# Patient Record
Sex: Female | Born: 1935 | Race: White | Hispanic: No | Marital: Married | Smoking: Never smoker
Health system: Southern US, Community
[De-identification: ages and names within clinical notes are randomized; demographics above are authoritative.]

## PROBLEM LIST (undated history)

## (undated) DIAGNOSIS — I10 Essential (primary) hypertension: Secondary | ICD-10-CM

## (undated) DIAGNOSIS — D649 Anemia, unspecified: Secondary | ICD-10-CM

## (undated) DIAGNOSIS — E119 Type 2 diabetes mellitus without complications: Secondary | ICD-10-CM

## (undated) DIAGNOSIS — H269 Unspecified cataract: Secondary | ICD-10-CM

## (undated) DIAGNOSIS — E785 Hyperlipidemia, unspecified: Secondary | ICD-10-CM

## (undated) DIAGNOSIS — M8580 Other specified disorders of bone density and structure, unspecified site: Secondary | ICD-10-CM

## (undated) DIAGNOSIS — M1990 Unspecified osteoarthritis, unspecified site: Secondary | ICD-10-CM

## (undated) DIAGNOSIS — E871 Hypo-osmolality and hyponatremia: Secondary | ICD-10-CM

## (undated) HISTORY — DX: Hyperlipidemia, unspecified: E78.5

## (undated) HISTORY — DX: Other specified disorders of bone density and structure, unspecified site: M85.80

## (undated) HISTORY — DX: Type 2 diabetes mellitus without complications: E11.9

## (undated) HISTORY — DX: Unspecified cataract: H26.9

## (undated) HISTORY — DX: Unspecified osteoarthritis, unspecified site: M19.90

## (undated) HISTORY — DX: Essential (primary) hypertension: I10

## (undated) HISTORY — PX: ABDOMINAL HYSTERECTOMY: SHX81

---

## 1999-10-14 ENCOUNTER — Other Ambulatory Visit: Admission: RE | Admit: 1999-10-14 | Discharge: 1999-10-14 | Payer: Self-pay | Admitting: Family Medicine

## 2015-11-08 ENCOUNTER — Other Ambulatory Visit: Payer: Self-pay | Admitting: Family Medicine

## 2015-11-08 ENCOUNTER — Ambulatory Visit
Admission: RE | Admit: 2015-11-08 | Discharge: 2015-11-08 | Disposition: A | Discharge status: Home / Self Care | Payer: Medicare Other | Source: Ambulatory Visit | Attending: Family Medicine | Admitting: Family Medicine

## 2015-11-08 DIAGNOSIS — M542 Cervicalgia: Secondary | ICD-10-CM

## 2016-03-08 ENCOUNTER — Ambulatory Visit (INDEPENDENT_AMBULATORY_CARE_PROVIDER_SITE_OTHER): Payer: Medicare Other | Admitting: Physician Assistant

## 2016-03-08 VITALS — BP 138/68 | HR 90 | Temp 97.6°F | Resp 18 | Ht 65.5 in | Wt 140.0 lb

## 2016-03-08 DIAGNOSIS — S0993XA Unspecified injury of face, initial encounter: Secondary | ICD-10-CM

## 2016-03-08 DIAGNOSIS — S80212A Abrasion, left knee, initial encounter: Secondary | ICD-10-CM | POA: Diagnosis not present

## 2016-03-08 DIAGNOSIS — T07XXXA Unspecified multiple injuries, initial encounter: Secondary | ICD-10-CM

## 2016-03-08 MED ORDER — MUPIROCIN 2 % EX OINT: 1.0000 "application " | TOPICAL_OINTMENT | Freq: Every day | CUTANEOUS | Status: AC

## 2016-03-08 NOTE — Patient Instructions (Addendum)
     IF you received an x-ray today, you will receive an invoice from [facility]. Please contact [facility] at [phone] with questions or concerns regarding your invoice.   IF you received labwork today, you will receive an invoice from [facility]. Please contact [facility] at [phone] with questions or concerns regarding your invoice.   Our billing staff will not be able to assist you with questions regarding bills from these companies.  You will be contacted with the lab results as soon as they are available. The fastest way to get your results is to activate your My Chart account. Instructions are located on the last page of this paperwork. If you have not heard from us regarding the results in 2 weeks, please contact this office.    Let's try warm compressions at this time.  You can do this 3 times per day for 15 minutes at least.  Please let me know if you developed increased pain, swelling, drainage.

## 2016-03-08 NOTE — Progress Notes (Signed)
[facility] [address], [place] [id number] [phone]  Date:  03/08/2016   Name:  Verdell FaceBarbara J Kulinski   DOB:  02/25/1936   MRN:  [id number]  PCP:  Cala BradfordWHITE,CYNTHIA S, MD    History of Present Illness:  Verdell FaceBarbara J Aure is a 80 y.o. female patient who [facility] swelling and bruising.    Saturday outside lost of balance while carrying groceries.  No loc.    Cold compresses were used.  She takes a baby aspirin.  She has not noticed vision changes, though her glasses were scratched up during the fall.  She has cleansed this with etOH and hydrogen peroxide.  She has no numbness or tingling.  There was minimal bleeding.    Rare falls.  She is a precautionary walker as granddaughter stated.  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  There are no active problems to display for this patient.   Past Medical History  Diagnosis Date  . Diabetes mellitus without complication (HCC)   . Hypertension   . Osteoarthritis   . Cataract   . Hyperlipidemia   . Osteopenia     Past Surgical History  Procedure Laterality Date  . Abdominal hysterectomy      Social History  Substance Use Topics  . Smoking status: Never Smoker   . Smokeless tobacco: None  . Alcohol Use: None    Family History  Problem Relation Age of Onset  . Hypertension Mother   . Cancer Father     No Known Allergies  Medication list has been reviewed and updated.  Current Outpatient Prescriptions on File Prior to Visit  Medication Sig Dispense Refill  . cholecalciferol (VITAMIN D) 1000 UNITS tablet Take 1,000 Units by mouth 4 (four) times daily as needed.    Marland Kitchen. lisinopril-hydrochlorothiazide (PRINZIDE,ZESTORETIC) 20-25 MG per tablet Take 1 tablet by mouth daily.    . metFORMIN (GLUCOPHAGE) 500 MG tablet Take 500 mg by mouth 2 (two) times daily with a meal.    . Omega-3 Fatty Acids (FISH OIL) 1000 MG CAPS Take by mouth.    . rosuvastatin (CRESTOR) 20 MG tablet Take 20 mg by  mouth daily.     No current facility-administered medications on file prior to visit.    ROS ROS otherwise unremarkable unless listed above.   Physical Examination: BP 138/68 mmHg  Pulse 90  Temp(Src) 97.6 F (36.4 C) (Oral)  Resp 18  Ht 5' 5.5" (1.664 m)  Wt 140 lb (63.504 kg)  BMI 22.93 kg/m2  SpO2 100% Ideal Body Weight: Weight in (lb) to have BMI = 25: 152.2  Physical Exam  Constitutional: She is oriented to person, place, and time. She appears well-developed and well-nourished. No distress.  HENT:  Head: Normocephalic and atraumatic.  Right Ear: External ear normal.  Left Ear: External ear normal.  Mouth/Throat: Normal dentition.  Left side of forehead with hematoma.  This is non-tender.  No tenderness along the occipital bones.  Ecchymosis that extends under the eye and descends into down the cheeks.  These are non-tender.    Eyes: EOM are normal. Pupils are equal, round, and reactive to light. Right conjunctiva is not injected. Left conjunctiva has a hemorrhage. Right eye exhibits normal extraocular motion. Left eye exhibits normal extraocular motion.  Cardiovascular: Normal rate.   Pulmonary/Chest: Effort normal. No respiratory distress.  Musculoskeletal:       Right knee: She exhibits normal range of motion. No tenderness found.  No medial joint line and no lateral joint line tenderness noted.  Neurological: She is alert and oriented to person, place, and time.  Skin: She is not diaphoretic.  Dorsal pip with abrasions, and left knee with 2cm shallow abrasion.  Psychiatric: She has a normal mood and affect. Her behavior is normal.     Assessment and Plan: Verdell FaceBarbara J Strohmeyer is a 80 y.o. female who is here today for facial swelling and bruising. This is healing.  Discussed healing process, and also potential of infection in this hematoma. -advised mupirocin -will use warm compresses at this time.  Advised to return for more alarming symptoms discussed.   Abrasions of  multiple sites - Plan: mupirocin ointment (BACTROBAN) 2 %  Trena PlattStephanie Terryl Niziolek, PA-C [facility] 03/08/2016 9:34 AM

## 2016-05-05 DIAGNOSIS — E871 Hypo-osmolality and hyponatremia: Secondary | ICD-10-CM

## 2016-05-05 DIAGNOSIS — D649 Anemia, unspecified: Secondary | ICD-10-CM

## 2016-05-05 HISTORY — DX: Anemia, unspecified: D64.9

## 2016-05-05 HISTORY — DX: Hypo-osmolality and hyponatremia: E87.1

## 2016-05-06 ENCOUNTER — Ambulatory Visit (INDEPENDENT_AMBULATORY_CARE_PROVIDER_SITE_OTHER): Payer: Medicare Other | Admitting: Family Medicine

## 2016-05-06 ENCOUNTER — Ambulatory Visit (INDEPENDENT_AMBULATORY_CARE_PROVIDER_SITE_OTHER): Payer: Medicare Other

## 2016-05-06 VITALS — BP 144/70 | HR 105 | Temp 97.4°F | Resp 18 | Ht 65.5 in | Wt 128.4 lb

## 2016-05-06 DIAGNOSIS — M5441 Lumbago with sciatica, right side: Secondary | ICD-10-CM

## 2016-05-06 DIAGNOSIS — R700 Elevated erythrocyte sedimentation rate: Secondary | ICD-10-CM | POA: Diagnosis not present

## 2016-05-06 DIAGNOSIS — R3915 Urgency of urination: Secondary | ICD-10-CM | POA: Diagnosis not present

## 2016-05-06 DIAGNOSIS — M5442 Lumbago with sciatica, left side: Secondary | ICD-10-CM

## 2016-05-06 DIAGNOSIS — R350 Frequency of micturition: Secondary | ICD-10-CM | POA: Diagnosis not present

## 2016-05-06 LAB — POCT URINALYSIS DIP (MANUAL ENTRY)
Glucose, UA: NEGATIVE
Nitrite, UA: NEGATIVE
Urobilinogen, UA: 4
pH, UA: 5.5

## 2016-05-06 LAB — POCT SEDIMENTATION RATE: POCT SED RATE: 72 mm/hr — AB (ref 0–22)

## 2016-05-06 MED ORDER — CEPHALEXIN 500 MG PO CAPS: 500.0000 mg | ORAL_CAPSULE | Freq: Two times a day (BID) | ORAL | 0 refills | Status: DC

## 2016-05-06 NOTE — Progress Notes (Signed)
By signing my name below I, Ellen Payne, attest that this documentation has been prepared under the direction and in the presence of Shade Flood, MD. Electonically Signed. Ellen Payne, Scribe 05/06/2016 at 10:09 AM   Subjective:    Patient ID: Ellen Payne, female    DOB: 02/26/1936, 80 y.o.   MRN: [id number]  Chief Complaint  Patient presents with  . Difficulty Walking    x 1 week    HPI Ellen Payne is a 80 y.o. female who [facility] complaining of low back pain that radiates into her buttocks bilat that has been constant for the past week. Pain radiates into her knees bilat. Pt also reports having a burning sensation in her feet bilat for the past week as well. Pt denies any fall or injury prior to pain. Family reports pt is having difficultly walking due to back pain. Pt denies history of back pain. Pt denies any fever, chills, night sweats, dysuria. Pt reports urinary urgency and urinary frequency for the past month. Pt denies urinary or bowel incontinence.   Pt was last seen at [facility] after pt lost her balance while carrying groceries and fell on 03/08/16. Pt denied having back pain after fall. Since pt fell a month ago pt has been using a walker to steady herself because she has been more nervous about falling.   Pt takes baby aspirin and tylenol arthritis daily.   Less strength in knees and ankles. Pt denies any unilateral weakness, facial drooping, slurred speech, or blurry vision.   No recent lab work available.   History of HTN, HLD, and DM  PCP is WHITE,CYNTHIA S, MD   There are no active problems to display for this patient.  Past Medical History:  Diagnosis Date  . Cataract   . Diabetes mellitus without complication (HCC)   . Hyperlipidemia   . Hypertension   . Osteoarthritis   . Osteopenia    Past Surgical History:  Procedure Laterality Date  . ABDOMINAL HYSTERECTOMY     No Known Allergies Prior to Admission  medications   Medication Sig Start Date End Date Taking? Authorizing Provider  cholecalciferol (VITAMIN D) 1000 UNITS tablet Take 1,000 Units by mouth 4 (four) times daily as needed.   Yes Historical Provider, MD  lisinopril-hydrochlorothiazide (PRINZIDE,ZESTORETIC) 20-25 MG per tablet Take 1 tablet by mouth daily.   Yes Historical Provider, MD  metFORMIN (GLUCOPHAGE) 500 MG tablet Take 500 mg by mouth 2 (two) times daily with a meal.   Yes Historical Provider, MD  Omega-3 Fatty Acids (FISH OIL) 1000 MG CAPS Take by mouth.   Yes Historical Provider, MD  rosuvastatin (CRESTOR) 20 MG tablet Take 20 mg by mouth daily.   Yes Historical Provider, MD   Social History   Social History  . Marital status: Married    Spouse name: N/A  . Number of children: N/A  . Years of education: N/A   Occupational History  . Not on file.   Social History Main Topics  . Smoking status: Never Smoker  . Smokeless tobacco: Not on file  . Alcohol use Not on file  . Drug use: Unknown  . Sexual activity: Not on file   Other Topics Concern  . Not on file   Social History Narrative  . No narrative on file      Review of Systems  Constitutional: Negative for diaphoresis and fever.  Genitourinary: Positive for frequency. Negative for dysuria.  Musculoskeletal: Positive for back pain (low back pain, radiating into legs bilat).  Neurological: Positive for weakness (bilat legs).       Objective:   Physical Exam  Constitutional: She is oriented to person, place, and time. She appears well-developed and well-nourished. No distress.  HENT:  Head: Normocephalic and atraumatic.  Eyes: Conjunctivae and EOM are normal. Pupils are equal, round, and reactive to light. Right eye exhibits no nystagmus. Left eye exhibits no nystagmus.  Neck: Neck supple.  Cardiovascular: Normal rate.   Pulmonary/Chest: Effort normal.  Musculoskeletal: Normal range of motion.  Pt is slow to stand, and able to rise on her own with  a walker. Pt is tender around the lower lumbar to sacrum bilat, and is diffusely tender to bilat sciatic notch. SI joints are minimally tender. Pt's ROM slightly guarded due to balance difficulty, otherwise intact. Gait is slow, able to weight bear both legs equally with walker.   Neurological: She is alert and oriented to person, place, and time. She displays no Babinski's sign on the right side. She displays no Babinski's sign on the left side.  Reflex Scores:      Patellar reflexes are 2+ on the right side and 2+ on the left side.      Achilles reflexes are 2+ on the right side and 2+ on the left side. Negative seated straight leg raise bilat.  No pronator drift.   Skin: Skin is warm and dry.  Psychiatric: She has a normal mood and affect. Her behavior is normal.  Nursing note and vitals reviewed.    Vitals:   05/06/16 0836  BP: (!) 144/70  Pulse: (!) 105  Resp: 18  Temp: 97.4 F (36.3 C)  TempSrc: Oral  SpO2: 96%  Weight: 128 lb 6 oz (58.2 kg)  Height: 5' 5.5" (1.664 m)   Dg Lumbar Spine Complete  Result Date: 05/06/2016 CLINICAL DATA:  Low back pain for 1 week. EXAM: LUMBAR SPINE - COMPLETE 4+ VIEW COMPARISON:  None. FINDINGS: No fractures identified. Spondylosis present, most prominently at L5-S1 with facet hypertrophy and anterolisthesis of L5 on S1 of approximately 8 mm. Associated disc space narrowing. Other levels show facet hypertrophy without severe disc space narrowing. Bones are diffusely osteopenic. There may be minimal loss of height of the mid L3 vertebral body, with scalloped appearance of the superior endplate. This does not appear acute. IMPRESSION: 1. Lumbar spondylosis, most prominently at L5-S1. There is a grade 1 anterolisthesis on a of L5 on S1 of approximately 8 mm. 2. Suggestion of mild loss of height at the level of the superior endplate of L3. This does not appear to be an acute compression fracture. Electronically Signed   By: Irish Lack M.D.   On:  05/06/2016 10:06     Results for orders placed or performed in visit on 05/06/16  POCT urinalysis dipstick  Result Value Ref Range   Color, UA yellow yellow   Clarity, UA clear clear   Glucose, UA negative negative   Bilirubin, UA small (A) negative   Ketones, POC UA trace (5) (A) negative   Spec Grav, UA >=1.030    Blood, UA moderate (A) negative   pH, UA 5.5    Protein Ur, POC >=300 (A) negative   Urobilinogen, UA 4.0    Nitrite, UA Negative Negative   Leukocytes, UA small (1+) (A) Negative  POCT SEDIMENTATION RATE  Result Value Ref Range   POCT SED RATE 72 (A) 0 - 22 mm/hr  Assessment & Plan:  .  VIOLA KINNICK is a 80 y.o. female Bilateral low back pain with sciatica, sciatica laterality unspecified - Plan: DG Lumbar Spine Complete, POCT SEDIMENTATION RATE  - Suspected degenerative disc disease with flare, but does have elevated sed rate and new onset back pain.   -initial treatment plan of increasing Tylenol, episodic Aleve, and recheck in 1 week.   - Based on elevated sedimentation rate, will schedule MRI to evaluate for underlying infection versus malignancy. Will try to have this ordered within the next few days. ER precautions if any fever, cauda equina symptoms or worsening symptoms prior to MRI results.  Urinary frequency - Plan: POCT urinalysis dipstick, Urine culture, cephALEXin (KEFLEX) 500 MG capsule, CANCELED: POCT Microscopic Urinalysis ([facility]) Urgency of urination - Plan: cephALEXin (KEFLEX) 500 MG capsule  - Possible early urinary tract infection with newurinary symptoms yesterday.    -Check urine culture, start Keflex.   Meds ordered this encounter  Medications  . cephALEXin (KEFLEX) 500 MG capsule    Sig: Take 1 capsule (500 mg total) by mouth 2 (two) times daily.    Dispense:  14 capsule    Refill:  0   Patient Instructions   You do have some significant arthritis in your low back that may be contributing to some of your pain. There is  also one area that may the an old compression fracture, but no apparent recent fracture. Start with Tylenol more frequently throughout the day up to maximum dose on label. Heat or ice to the affected area as needed. If needed you can take over the counter Advil or Aleve for up to one week. If not improving into next week, I would like you to see an orthopedic specialist. If any worsening of your pain or symptoms in that time, be seen here or other medical provider sooner. Keep follow-up with your primary care provider to discuss your walking difficulty further, or return here or emergency room sooner if this is worsening.  Your urine test did indicate some possible infection, but the urine culture will be more helpful. In the meantime you can start the antibiotic that was prescribed twice per day.   Return to the clinic or go to the nearest emergency room if any of your symptoms worsen or new symptoms occur.   Urinary Tract Infection Urinary tract infections (UTIs) can develop anywhere along your urinary tract. Your urinary tract is your body's drainage system for removing wastes and extra water. Your urinary tract includes two kidneys, two ureters, a bladder, and a urethra. Your kidneys are a pair of bean-shaped organs. Each kidney is about the size of your fist. They are located below your ribs, one on each side of your spine. CAUSES Infections are caused by microbes, which are microscopic organisms, including fungi, viruses, and bacteria. These organisms are so small that they can only be seen through a microscope. Bacteria are the microbes that most commonly cause UTIs. SYMPTOMS  Symptoms of UTIs may vary by age and gender of the patient and by the location of the infection. Symptoms in young women typically include a frequent and intense urge to urinate and a painful, burning feeling in the bladder or urethra during urination. Older women and men are more likely to be tired, shaky, and weak and have  muscle aches and abdominal pain. A fever may mean the infection is in your kidneys. Other symptoms of a kidney infection include pain in your back or sides below the  ribs, nausea, and vomiting. DIAGNOSIS To diagnose a UTI, your caregiver will ask you about your symptoms. Your caregiver will also ask you to provide a urine sample. The urine sample will be tested for bacteria and white blood cells. White blood cells are made by your body to help fight infection. TREATMENT  Typically, UTIs can be treated with medication. Because most UTIs are caused by a bacterial infection, they usually can be treated with the use of antibiotics. The choice of antibiotic and length of treatment depend on your symptoms and the type of bacteria causing your infection. HOME CARE INSTRUCTIONS  If you were prescribed antibiotics, take them exactly as your caregiver instructs you. Finish the medication even if you feel better after you have only taken some of the medication.  Drink enough water and fluids to keep your urine clear or pale yellow.  Avoid caffeine, tea, and carbonated beverages. They tend to irritate your bladder.  Empty your bladder often. Avoid holding urine for long periods of time.  Empty your bladder before and after sexual intercourse.  After a bowel movement, women should cleanse from front to back. Use each tissue only once. SEEK MEDICAL CARE IF:   You have back pain.  You develop a fever.  Your symptoms do not begin to resolve within 3 days. SEEK IMMEDIATE MEDICAL CARE IF:   You have severe back pain or lower abdominal pain.  You develop chills.  You have nausea or vomiting.  You have continued burning or discomfort with urination. MAKE SURE YOU:   Understand these instructions.  Will watch your condition.  Will get help right away if you are not doing well or get worse.   This information is not intended to replace advice given to you by your health care provider. Make sure  you discuss any questions you have with your health care provider.   Document Released: 05/31/2005 Document Revised: 05/12/2015 Document Reviewed: 09/29/2011 Elsevier Interactive Patient Education 2016 Elsevier Inc.   Back Pain, Adult Back pain is very common in adults.The cause of back pain is rarely dangerous and the pain often gets better over time.The cause of your back pain may not be known. Some common causes of back pain include:  Strain of the muscles or ligaments supporting the spine.  Wear and tear (degeneration) of the spinal disks.  Arthritis.  Direct injury to the back. For many people, back pain may return. Since back pain is rarely dangerous, most people can learn to manage this condition on their own. HOME CARE INSTRUCTIONS Watch your back pain for any changes. The following actions may help to lessen any discomfort you are feeling:  Remain active. It is stressful on your back to sit or stand in one place for long periods of time. Do not sit, drive, or stand in one place for more than 30 minutes at a time. Take short walks on even surfaces as soon as you are able.Try to increase the length of time you walk each day.  Exercise regularly as directed by your health care provider. Exercise helps your back heal faster. It also helps avoid future injury by keeping your muscles strong and flexible.  Do not stay in bed.Resting more than 1-2 days can delay your recovery.  Pay attention to your body when you bend and lift. The most comfortable positions are those that put less stress on your recovering back. Always use proper lifting techniques, including:  Bending your knees.  Keeping the load close to your  body.  Avoiding twisting.  Find a comfortable position to sleep. Use a firm mattress and lie on your side with your knees slightly bent. If you lie on your back, put a pillow under your knees.  Avoid feeling anxious or stressed.Stress increases muscle tension and  can worsen back pain.It is important to recognize when you are anxious or stressed and learn ways to manage it, such as with exercise.  Take medicines only as directed by your health care provider. Over-the-counter medicines to reduce pain and inflammation are often the most helpful.Your health care provider may prescribe muscle relaxant drugs.These medicines help dull your pain so you can more quickly return to your normal activities and healthy exercise.  Apply ice to the injured area:  Put ice in a plastic bag.  Place a towel between your skin and the bag.  Leave the ice on for 20 minutes, 2-3 times a day for the first 2-3 days. After that, ice and heat may be alternated to reduce pain and spasms.  Maintain a healthy weight. Excess weight puts extra stress on your back and makes it difficult to maintain good posture. SEEK MEDICAL CARE IF:  You have pain that is not relieved with rest or medicine.  You have increasing pain going down into the legs or buttocks.  You have pain that does not improve in one week.  You have night pain.  You lose weight.  You have a fever or chills. SEEK IMMEDIATE MEDICAL CARE IF:   You develop new bowel or bladder control problems.  You have unusual weakness or numbness in your arms or legs.  You develop nausea or vomiting.  You develop abdominal pain.  You feel faint.   This information is not intended to replace advice given to you by your health care provider. Make sure you discuss any questions you have with your health care provider.   Document Released: 08/21/2005 Document Revised: 09/11/2014 Document Reviewed: 12/23/2013 Elsevier Interactive Patient Education 2016 [employer].   IF you received an x-ray today, you will receive an invoice from [facility]. Please contact [facility] at [phone] with questions or concerns regarding your invoice.   IF you received labwork today, you will receive an invoice  from [facility]. Please contact [facility] at [phone] with questions or concerns regarding your invoice.   Our billing staff will not be able to assist you with questions regarding bills from these companies.  You will be contacted with the lab results as soon as they are available. The fastest way to get your results is to activate your My Chart account. Instructions are located on the last page of this paperwork. If you have not heard from [place] regarding the results in 2 weeks, please contact this office.        I personally performed the services described in this documentation, which was scribed in my presence. The recorded information has been reviewed and considered, and addended by me as needed.   Signed,   Meredith Staggers, MD [facility].  05/06/16 4:28 PM

## 2016-05-06 NOTE — Patient Instructions (Addendum)
You do have some significant arthritis in your low back that may be contributing to some of your pain. There is also one area that may the an old compression fracture, but no apparent recent fracture. Start with Tylenol more frequently throughout the day up to maximum dose on label. Heat or ice to the affected area as needed. If needed you can take over the counter Advil or Aleve for up to one week. If not improving into next week, I would like you to see an orthopedic specialist. If any worsening of your pain or symptoms in that time, be seen here or other medical provider sooner. Keep follow-up with your primary care provider to discuss your walking difficulty further, or return here or emergency room sooner if this is worsening.  Your urine test did indicate some possible infection, but the urine culture will be more helpful. In the meantime you can start the antibiotic that was prescribed twice per day.   Return to the clinic or go to the nearest emergency room if any of your symptoms worsen or new symptoms occur.   Urinary Tract Infection Urinary tract infections (UTIs) can develop anywhere along your urinary tract. Your urinary tract is your body's drainage system for removing wastes and extra water. Your urinary tract includes two kidneys, two ureters, a bladder, and a urethra. Your kidneys are a pair of bean-shaped organs. Each kidney is about the size of your fist. They are located below your ribs, one on each side of your spine. CAUSES Infections are caused by microbes, which are microscopic organisms, including fungi, viruses, and bacteria. These organisms are so small that they can only be seen through a microscope. Bacteria are the microbes that most commonly cause UTIs. SYMPTOMS  Symptoms of UTIs may vary by age and gender of the patient and by the location of the infection. Symptoms in young women typically include a frequent and intense urge to urinate and a painful, burning feeling in  the bladder or urethra during urination. Older women and men are more likely to be tired, shaky, and weak and have muscle aches and abdominal pain. A fever may mean the infection is in your kidneys. Other symptoms of a kidney infection include pain in your back or sides below the ribs, nausea, and vomiting. DIAGNOSIS To diagnose a UTI, your caregiver will ask you about your symptoms. Your caregiver will also ask you to provide a urine sample. The urine sample will be tested for bacteria and white blood cells. White blood cells are made by your body to help fight infection. TREATMENT  Typically, UTIs can be treated with medication. Because most UTIs are caused by a bacterial infection, they usually can be treated with the use of antibiotics. The choice of antibiotic and length of treatment depend on your symptoms and the type of bacteria causing your infection. HOME CARE INSTRUCTIONS  If you were prescribed antibiotics, take them exactly as your caregiver instructs you. Finish the medication even if you feel better after you have only taken some of the medication.  Drink enough water and fluids to keep your urine clear or pale yellow.  Avoid caffeine, tea, and carbonated beverages. They tend to irritate your bladder.  Empty your bladder often. Avoid holding urine for long periods of time.  Empty your bladder before and after sexual intercourse.  After a bowel movement, women should cleanse from front to back. Use each tissue only once. SEEK MEDICAL CARE IF:   You have back pain.  You develop a fever.  Your symptoms do not begin to resolve within 3 days. SEEK IMMEDIATE MEDICAL CARE IF:   You have severe back pain or lower abdominal pain.  You develop chills.  You have nausea or vomiting.  You have continued burning or discomfort with urination. MAKE SURE YOU:   Understand these instructions.  Will watch your condition.  Will get help right away if you are not doing well or get  worse.   This information is not intended to replace advice given to you by your health care provider. Make sure you discuss any questions you have with your health care provider.   Document Released: 05/31/2005 Document Revised: 05/12/2015 Document Reviewed: 09/29/2011 Elsevier Interactive Patient Education 2016 Elsevier Inc.   Back Pain, Adult Back pain is very common in adults.The cause of back pain is rarely dangerous and the pain often gets better over time.The cause of your back pain may not be known. Some common causes of back pain include:  Strain of the muscles or ligaments supporting the spine.  Wear and tear (degeneration) of the spinal disks.  Arthritis.  Direct injury to the back. For many people, back pain may return. Since back pain is rarely dangerous, most people can learn to manage this condition on their own. HOME CARE INSTRUCTIONS Watch your back pain for any changes. The following actions may help to lessen any discomfort you are feeling:  Remain active. It is stressful on your back to sit or stand in one place for long periods of time. Do not sit, drive, or stand in one place for more than 30 minutes at a time. Take short walks on even surfaces as soon as you are able.Try to increase the length of time you walk each day.  Exercise regularly as directed by your health care provider. Exercise helps your back heal faster. It also helps avoid future injury by keeping your muscles strong and flexible.  Do not stay in bed.Resting more than 1-2 days can delay your recovery.  Pay attention to your body when you bend and lift. The most comfortable positions are those that put less stress on your recovering back. Always use proper lifting techniques, including:  Bending your knees.  Keeping the load close to your body.  Avoiding twisting.  Find a comfortable position to sleep. Use a firm mattress and lie on your side with your knees slightly bent. If you lie on  your back, put a pillow under your knees.  Avoid feeling anxious or stressed.Stress increases muscle tension and can worsen back pain.It is important to recognize when you are anxious or stressed and learn ways to manage it, such as with exercise.  Take medicines only as directed by your health care provider. Over-the-counter medicines to reduce pain and inflammation are often the most helpful.Your health care provider may prescribe muscle relaxant drugs.These medicines help dull your pain so you can more quickly return to your normal activities and healthy exercise.  Apply ice to the injured area:  Put ice in a plastic bag.  Place a towel between your skin and the bag.  Leave the ice on for 20 minutes, 2-3 times a day for the first 2-3 days. After that, ice and heat may be alternated to reduce pain and spasms.  Maintain a healthy weight. Excess weight puts extra stress on your back and makes it difficult to maintain good posture. SEEK MEDICAL CARE IF:  You have pain that is not relieved with rest or  medicine.  You have increasing pain going down into the legs or buttocks.  You have pain that does not improve in one week.  You have night pain.  You lose weight.  You have a fever or chills. SEEK IMMEDIATE MEDICAL CARE IF:   You develop new bowel or bladder control problems.  You have unusual weakness or numbness in your arms or legs.  You develop nausea or vomiting.  You develop abdominal pain.  You feel faint.   This information is not intended to replace advice given to you by your health care provider. Make sure you discuss any questions you have with your health care provider.   Document Released: 08/21/2005 Document Revised: 09/11/2014 Document Reviewed: 12/23/2013 Elsevier Interactive Patient Education 2016 [employer]   IF you received an x-ray today, you will receive an invoice from [facility]. Please contact [facility] at [phone]  with questions or concerns regarding your invoice.   IF you received labwork today, you will receive an invoice from [facility]. Please contact [facility] at [phone] with questions or concerns regarding your invoice.   Our billing staff will not be able to assist you with questions regarding bills from these companies.  You will be contacted with the lab results as soon as they are available. The fastest way to get your results is to activate your My Chart account. Instructions are located on the last page of this paperwork. If you have not heard from us regarding the results in 2 weeks, please contact this office.

## 2016-05-08 LAB — URINE CULTURE

## 2016-05-15 ENCOUNTER — Encounter (HOSPITAL_COMMUNITY): Payer: Self-pay | Admitting: Emergency Medicine

## 2016-05-15 DIAGNOSIS — R3915 Urgency of urination: Secondary | ICD-10-CM | POA: Diagnosis present

## 2016-05-15 DIAGNOSIS — Z6820 Body mass index (BMI) 20.0-20.9, adult: Secondary | ICD-10-CM

## 2016-05-15 DIAGNOSIS — A419 Sepsis, unspecified organism: Principal | ICD-10-CM | POA: Diagnosis present

## 2016-05-15 DIAGNOSIS — E538 Deficiency of other specified B group vitamins: Secondary | ICD-10-CM | POA: Diagnosis present

## 2016-05-15 DIAGNOSIS — E119 Type 2 diabetes mellitus without complications: Secondary | ICD-10-CM | POA: Diagnosis present

## 2016-05-15 DIAGNOSIS — M545 Low back pain: Secondary | ICD-10-CM | POA: Diagnosis present

## 2016-05-15 DIAGNOSIS — Z79899 Other long term (current) drug therapy: Secondary | ICD-10-CM

## 2016-05-15 DIAGNOSIS — Z7982 Long term (current) use of aspirin: Secondary | ICD-10-CM

## 2016-05-15 DIAGNOSIS — R4182 Altered mental status, unspecified: Secondary | ICD-10-CM | POA: Diagnosis not present

## 2016-05-15 DIAGNOSIS — E871 Hypo-osmolality and hyponatremia: Secondary | ICD-10-CM | POA: Diagnosis not present

## 2016-05-15 DIAGNOSIS — Z7984 Long term (current) use of oral hypoglycemic drugs: Secondary | ICD-10-CM

## 2016-05-15 DIAGNOSIS — R350 Frequency of micturition: Secondary | ICD-10-CM | POA: Diagnosis present

## 2016-05-15 DIAGNOSIS — E440 Moderate protein-calorie malnutrition: Secondary | ICD-10-CM | POA: Diagnosis present

## 2016-05-15 DIAGNOSIS — D649 Anemia, unspecified: Secondary | ICD-10-CM | POA: Diagnosis present

## 2016-05-15 DIAGNOSIS — I10 Essential (primary) hypertension: Secondary | ICD-10-CM | POA: Diagnosis present

## 2016-05-15 DIAGNOSIS — E860 Dehydration: Secondary | ICD-10-CM | POA: Diagnosis present

## 2016-05-15 DIAGNOSIS — N390 Urinary tract infection, site not specified: Secondary | ICD-10-CM | POA: Diagnosis present

## 2016-05-15 DIAGNOSIS — Z90710 Acquired absence of both cervix and uterus: Secondary | ICD-10-CM

## 2016-05-15 DIAGNOSIS — E876 Hypokalemia: Secondary | ICD-10-CM | POA: Diagnosis present

## 2016-05-15 NOTE — ED Triage Notes (Signed)
Pt here with bilateral hip pain and left knee pain. Pt sts she believes that she has arthritis. No injury. Pt ambulatory with walker.

## 2016-05-16 ENCOUNTER — Emergency Department (HOSPITAL_COMMUNITY): Payer: Medicare Other

## 2016-05-16 ENCOUNTER — Inpatient Hospital Stay (HOSPITAL_COMMUNITY): Payer: Medicare Other

## 2016-05-16 ENCOUNTER — Encounter (HOSPITAL_COMMUNITY): Payer: Self-pay | Admitting: Family Medicine

## 2016-05-16 ENCOUNTER — Inpatient Hospital Stay (HOSPITAL_COMMUNITY)
Admission: EM | Admit: 2016-05-16 | Discharge: 2016-05-22 | DRG: 872 | Disposition: A | Discharge status: Skilled Nursing Facility | Payer: Medicare Other | Attending: Internal Medicine | Admitting: Internal Medicine

## 2016-05-16 DIAGNOSIS — G9341 Metabolic encephalopathy: Secondary | ICD-10-CM | POA: Diagnosis not present

## 2016-05-16 DIAGNOSIS — E119 Type 2 diabetes mellitus without complications: Secondary | ICD-10-CM

## 2016-05-16 DIAGNOSIS — Z79899 Other long term (current) drug therapy: Secondary | ICD-10-CM | POA: Diagnosis not present

## 2016-05-16 DIAGNOSIS — E538 Deficiency of other specified B group vitamins: Secondary | ICD-10-CM | POA: Diagnosis present

## 2016-05-16 DIAGNOSIS — R634 Abnormal weight loss: Secondary | ICD-10-CM

## 2016-05-16 DIAGNOSIS — R4182 Altered mental status, unspecified: Secondary | ICD-10-CM | POA: Diagnosis not present

## 2016-05-16 DIAGNOSIS — D649 Anemia, unspecified: Secondary | ICD-10-CM | POA: Diagnosis present

## 2016-05-16 DIAGNOSIS — A419 Sepsis, unspecified organism: Secondary | ICD-10-CM | POA: Diagnosis present

## 2016-05-16 DIAGNOSIS — Z7984 Long term (current) use of oral hypoglycemic drugs: Secondary | ICD-10-CM | POA: Diagnosis not present

## 2016-05-16 DIAGNOSIS — E440 Moderate protein-calorie malnutrition: Secondary | ICD-10-CM | POA: Diagnosis present

## 2016-05-16 DIAGNOSIS — Z7982 Long term (current) use of aspirin: Secondary | ICD-10-CM | POA: Diagnosis not present

## 2016-05-16 DIAGNOSIS — R350 Frequency of micturition: Secondary | ICD-10-CM | POA: Diagnosis present

## 2016-05-16 DIAGNOSIS — M545 Low back pain, unspecified: Secondary | ICD-10-CM

## 2016-05-16 DIAGNOSIS — N390 Urinary tract infection, site not specified: Secondary | ICD-10-CM | POA: Diagnosis present

## 2016-05-16 DIAGNOSIS — I10 Essential (primary) hypertension: Secondary | ICD-10-CM | POA: Diagnosis present

## 2016-05-16 DIAGNOSIS — E876 Hypokalemia: Secondary | ICD-10-CM | POA: Diagnosis present

## 2016-05-16 DIAGNOSIS — Z6820 Body mass index (BMI) 20.0-20.9, adult: Secondary | ICD-10-CM | POA: Diagnosis not present

## 2016-05-16 DIAGNOSIS — R9431 Abnormal electrocardiogram [ECG] [EKG]: Secondary | ICD-10-CM | POA: Diagnosis present

## 2016-05-16 DIAGNOSIS — E871 Hypo-osmolality and hyponatremia: Secondary | ICD-10-CM

## 2016-05-16 DIAGNOSIS — Z90710 Acquired absence of both cervix and uterus: Secondary | ICD-10-CM | POA: Diagnosis not present

## 2016-05-16 DIAGNOSIS — E860 Dehydration: Secondary | ICD-10-CM | POA: Diagnosis present

## 2016-05-16 DIAGNOSIS — R3915 Urgency of urination: Secondary | ICD-10-CM | POA: Diagnosis present

## 2016-05-16 DIAGNOSIS — I4581 Long QT syndrome: Secondary | ICD-10-CM

## 2016-05-16 DIAGNOSIS — R262 Difficulty in walking, not elsewhere classified: Secondary | ICD-10-CM

## 2016-05-16 HISTORY — DX: Hypo-osmolality and hyponatremia: E87.1

## 2016-05-16 HISTORY — DX: Anemia, unspecified: D64.9

## 2016-05-16 LAB — TSH: TSH: 1.396 u[IU]/mL (ref 0.350–4.500)

## 2016-05-16 LAB — BASIC METABOLIC PANEL
Anion gap: 12 (ref 5–15)
BUN: 15 mg/dL (ref 6–20)
CHLORIDE: 88 mmol/L — AB (ref 101–111)
CO2: 31 mmol/L (ref 22–32)
CREATININE: 0.73 mg/dL (ref 0.44–1.00)
Calcium: 9 mg/dL (ref 8.9–10.3)
GFR calc non Af Amer: >60 mL/min (ref >60)
Glucose, Bld: 124 mg/dL — ABNORMAL HIGH (ref 65–99)
POTASSIUM: 3 mmol/L — AB (ref 3.5–5.1)
Sodium: 131 mmol/L — ABNORMAL LOW (ref 135–145)

## 2016-05-16 LAB — COMPREHENSIVE METABOLIC PANEL
ALK PHOS: 154 U/L — AB (ref 38–126)
ALT: 17 U/L (ref 14–54)
AST: 36 U/L (ref 15–41)
Albumin: 3.1 g/dL — ABNORMAL LOW (ref 3.5–5.0)
Anion gap: 14 (ref 5–15)
BILIRUBIN TOTAL: 0.8 mg/dL (ref 0.3–1.2)
BUN: 23 mg/dL — AB (ref 6–20)
CALCIUM: 8.7 mg/dL — AB (ref 8.9–10.3)
CO2: 28 mmol/L (ref 22–32)
Chloride: 84 mmol/L — ABNORMAL LOW (ref 101–111)
Creatinine, Ser: 0.77 mg/dL (ref 0.44–1.00)
GFR calc Af Amer: >60 mL/min (ref >60)
Glucose, Bld: 183 mg/dL — ABNORMAL HIGH (ref 65–99)
POTASSIUM: 2.8 mmol/L — AB (ref 3.5–5.1)
Sodium: 126 mmol/L — ABNORMAL LOW (ref 135–145)
TOTAL PROTEIN: 5.9 g/dL — AB (ref 6.5–8.1)

## 2016-05-16 LAB — GLUCOSE, CAPILLARY
GLUCOSE-CAPILLARY: 175 mg/dL — AB (ref 65–99)
GLUCOSE-CAPILLARY: 186 mg/dL — AB (ref 65–99)
Glucose-Capillary: 164 mg/dL — ABNORMAL HIGH (ref 65–99)
Glucose-Capillary: 135 mg/dL — ABNORMAL HIGH (ref 65–99)

## 2016-05-16 LAB — CBC WITH DIFFERENTIAL/PLATELET
Basophils Absolute: 0 10*3/uL (ref 0.0–0.1)
Basophils Relative: 0 %
Eosinophils Absolute: 0 10*3/uL (ref 0.0–0.7)
Eosinophils Relative: 0 %
HEMATOCRIT: 33.3 % — AB (ref 36.0–46.0)
Hemoglobin: 11.1 g/dL — ABNORMAL LOW (ref 12.0–15.0)
LYMPHS ABS: 0.8 10*3/uL (ref 0.7–4.0)
LYMPHS PCT: 7 %
MCH: 30.7 pg (ref 26.0–34.0)
MCHC: 33.3 g/dL (ref 30.0–36.0)
MCV: 92 fL (ref 78.0–100.0)
MONO ABS: 1 10*3/uL (ref 0.1–1.0)
MONOS PCT: 8 %
NEUTROS ABS: 10.4 10*3/uL — AB (ref 1.7–7.7)
Neutrophils Relative %: 85 %
Platelets: 655 10*3/uL — ABNORMAL HIGH (ref 150–400)
RBC: 3.62 MIL/uL — ABNORMAL LOW (ref 3.87–5.11)
RDW: 13.5 % (ref 11.5–15.5)
WBC: 12.2 10*3/uL — ABNORMAL HIGH (ref 4.0–10.5)

## 2016-05-16 LAB — URINE MICROSCOPIC-ADD ON

## 2016-05-16 LAB — FERRITIN: Ferritin: 93 ng/mL (ref 11–307)

## 2016-05-16 LAB — URINALYSIS, ROUTINE W REFLEX MICROSCOPIC
BILIRUBIN URINE: NEGATIVE
GLUCOSE, UA: NEGATIVE mg/dL
KETONES UR: NEGATIVE mg/dL
Nitrite: POSITIVE — AB
PH: 5.5 (ref 5.0–8.0)
Protein, ur: 100 mg/dL — AB

## 2016-05-16 LAB — LACTIC ACID, PLASMA
LACTIC ACID, VENOUS: 2.8 mmol/L — AB (ref 0.5–1.9)
LACTIC ACID, VENOUS: 2.2 mmol/L — AB (ref 0.5–1.9)

## 2016-05-16 LAB — VITAMIN B12: Vitamin B-12: 66 pg/mL — ABNORMAL LOW (ref 180–914)

## 2016-05-16 LAB — MAGNESIUM: Magnesium: 1.3 mg/dL — ABNORMAL LOW (ref 1.7–2.4)

## 2016-05-16 LAB — I-STAT CG4 LACTIC ACID, ED: LACTIC ACID, VENOUS: 2.11 mmol/L — AB (ref 0.5–1.9)

## 2016-05-16 LAB — SEDIMENTATION RATE: SED RATE: 26 mm/h — AB (ref 0–22)

## 2016-05-16 LAB — OSMOLALITY: Osmolality: 277 mOsm/kg (ref 275–295)

## 2016-05-16 MED ORDER — DEXTROSE 5 % IV SOLN
1.0000 g | INTRAVENOUS | Status: DC
  Administered 2016-05-16 – 2016-05-21 (×6): 1 g via INTRAVENOUS
  Filled 2016-05-16 (×8): qty 10

## 2016-05-16 MED ORDER — SODIUM CHLORIDE 0.9 % IV BOLUS (SEPSIS)
500.0000 mL | Freq: Once | INTRAVENOUS | Status: AC
  Administered 2016-05-16: 500 mL via INTRAVENOUS

## 2016-05-16 MED ORDER — POTASSIUM CHLORIDE CRYS ER 20 MEQ PO TBCR
20.0000 meq | EXTENDED_RELEASE_TABLET | Freq: Once | ORAL | Status: AC
  Administered 2016-05-16: 20 meq via ORAL
  Filled 2016-05-16: qty 1

## 2016-05-16 MED ORDER — ACETAMINOPHEN 325 MG PO TABS
650.0000 mg | ORAL_TABLET | Freq: Four times a day (QID) | ORAL | Status: DC | PRN
  Administered 2016-05-16 – 2016-05-22 (×7): 650 mg via ORAL
  Filled 2016-05-16 (×7): qty 2

## 2016-05-16 MED ORDER — IOPAMIDOL (ISOVUE-300) INJECTION 61%
15.0000 mL | INTRAVENOUS | Status: AC
  Administered 2016-05-16: 15 mL via ORAL

## 2016-05-16 MED ORDER — ASPIRIN EC 81 MG PO TBEC
81.0000 mg | DELAYED_RELEASE_TABLET | Freq: Every day | ORAL | Status: DC
  Administered 2016-05-16 – 2016-05-22 (×7): 81 mg via ORAL
  Filled 2016-05-16 (×7): qty 1

## 2016-05-16 MED ORDER — ATORVASTATIN CALCIUM 40 MG PO TABS
40.0000 mg | ORAL_TABLET | Freq: Every day | ORAL | Status: DC
  Administered 2016-05-16 – 2016-05-21 (×6): 40 mg via ORAL
  Filled 2016-05-16 (×6): qty 1

## 2016-05-16 MED ORDER — FLUCONAZOLE 100 MG PO TABS
150.0000 mg | ORAL_TABLET | Freq: Once | ORAL | Status: AC
  Administered 2016-05-16: 150 mg via ORAL
  Filled 2016-05-16: qty 2

## 2016-05-16 MED ORDER — MAGNESIUM SULFATE 2 GM/50ML IV SOLN
2.0000 g | Freq: Once | INTRAVENOUS | Status: AC
  Administered 2016-05-16: 2 g via INTRAVENOUS
  Filled 2016-05-16: qty 50

## 2016-05-16 MED ORDER — ACETAMINOPHEN 650 MG RE SUPP: 650.0000 mg | Freq: Four times a day (QID) | RECTAL | Status: DC | PRN

## 2016-05-16 MED ORDER — POTASSIUM CHLORIDE CRYS ER 20 MEQ PO TBCR
60.0000 meq | EXTENDED_RELEASE_TABLET | Freq: Once | ORAL | Status: AC
  Administered 2016-05-16: 60 meq via ORAL
  Filled 2016-05-16: qty 3

## 2016-05-16 MED ORDER — INSULIN ASPART 100 UNIT/ML ~~LOC~~ SOLN
0.0000 [IU] | Freq: Every day | SUBCUTANEOUS | Status: DC
  Administered 2016-05-20 – 2016-05-21 (×2): 2 [IU] via SUBCUTANEOUS

## 2016-05-16 MED ORDER — SODIUM CHLORIDE 0.9 % IV BOLUS (SEPSIS): 1000.0000 mL | Freq: Once | INTRAVENOUS | Status: DC

## 2016-05-16 MED ORDER — SODIUM CHLORIDE 0.9 % IV BOLUS (SEPSIS)
1000.0000 mL | Freq: Once | INTRAVENOUS | Status: AC
  Administered 2016-05-16: 1000 mL via INTRAVENOUS

## 2016-05-16 MED ORDER — SODIUM CHLORIDE 0.9% FLUSH
3.0000 mL | Freq: Two times a day (BID) | INTRAVENOUS | Status: DC
  Administered 2016-05-16 – 2016-05-22 (×11): 3 mL via INTRAVENOUS

## 2016-05-16 MED ORDER — ENSURE ENLIVE PO LIQD
237.0000 mL | Freq: Two times a day (BID) | ORAL | Status: DC
  Administered 2016-05-16 – 2016-05-17 (×3): 237 mL via ORAL

## 2016-05-16 MED ORDER — ENOXAPARIN SODIUM 40 MG/0.4ML ~~LOC~~ SOLN
40.0000 mg | SUBCUTANEOUS | Status: DC
  Administered 2016-05-16: 40 mg via SUBCUTANEOUS
  Filled 2016-05-16 (×2): qty 0.4

## 2016-05-16 MED ORDER — CYANOCOBALAMIN 1000 MCG/ML IJ SOLN
1000.0000 ug | Freq: Every day | INTRAMUSCULAR | Status: DC
  Administered 2016-05-16 – 2016-05-22 (×7): 1000 ug via INTRAMUSCULAR
  Filled 2016-05-16 (×7): qty 1

## 2016-05-16 MED ORDER — DEXTROSE 5 % IV SOLN
1.0000 g | Freq: Once | INTRAVENOUS | Status: AC
  Administered 2016-05-16: 1 g via INTRAVENOUS
  Filled 2016-05-16: qty 10

## 2016-05-16 MED ORDER — POTASSIUM CHLORIDE CRYS ER 20 MEQ PO TBCR
20.0000 meq | EXTENDED_RELEASE_TABLET | Freq: Two times a day (BID) | ORAL | Status: DC
  Administered 2016-05-16 – 2016-05-22 (×13): 20 meq via ORAL
  Filled 2016-05-16 (×13): qty 1

## 2016-05-16 MED ORDER — IOPAMIDOL (ISOVUE-300) INJECTION 61%
INTRAVENOUS | Status: AC
  Administered 2016-05-16: 80 mL
  Filled 2016-05-16: qty 100

## 2016-05-16 MED ORDER — INSULIN ASPART 100 UNIT/ML ~~LOC~~ SOLN
0.0000 [IU] | Freq: Three times a day (TID) | SUBCUTANEOUS | Status: DC
  Administered 2016-05-16 (×2): 2 [IU] via SUBCUTANEOUS
  Administered 2016-05-16: 1 [IU] via SUBCUTANEOUS
  Administered 2016-05-17: 2 [IU] via SUBCUTANEOUS
  Administered 2016-05-17: 5 [IU] via SUBCUTANEOUS
  Administered 2016-05-17: 2 [IU] via SUBCUTANEOUS
  Administered 2016-05-18 – 2016-05-19 (×4): 3 [IU] via SUBCUTANEOUS
  Administered 2016-05-19 (×2): 2 [IU] via SUBCUTANEOUS
  Administered 2016-05-20: 3 [IU] via SUBCUTANEOUS
  Administered 2016-05-20 – 2016-05-21 (×3): 2 [IU] via SUBCUTANEOUS
  Administered 2016-05-21 – 2016-05-22 (×4): 3 [IU] via SUBCUTANEOUS

## 2016-05-16 NOTE — Progress Notes (Signed)
Patient was admitted earlier this morning. H&P reviewed. Patient seen and examined. Discussed with patient and daughter.  Patient feels better. Still weak. Denies any chest pain, shortness of breath. No nausea, vomiting. Has been finding it difficult to ambulate for the past many days along with worsening back pain over the last few weeks.  Vital signs reviewed. Note to be stable. Heart rate has improved.  Lungs are clear to auscultation bilaterally. No wheezing, rales or rhonchi. S1, S2 normal. Regular. No S3, S4. No rubs, murmurs, or bruit. No pedal edema. Abdomen is soft, nontender, nondistended. Bowel sounds are present. No masses or organomegaly No facial asymmetry. Tongue is midline. Motor strength appears to be equal bilaterally. Brisk reflexes noted in the right lower extremity.  Labs reviewed. Vitamin B-12 to be low. Folic acid level will be ordered. Sodium level has improved. Potassium remains low, though improved. Lactic acid is 2.8. CT scan of the abdomen and pelvis along with CT scan of the chest is pending. ESR is 26.  Management as outlined in the H&P. Continue antibiotics. Await cultures. Follow-up on results of CT scan of the chest, abdomen and pelvis. She may need to undergo MRI of her lumbar spine if CT scan does not show anything obvious. Continue to monitor electrolytes. Fluid bolus. Repeat lactic acid level. Replace vitamin B-12 daily for now. Follow-up on folic acid level.  Bonnielee Haff 05/16/2016

## 2016-05-16 NOTE — Progress Notes (Signed)
CRITICAL VALUE ALERT  Critical value received:  lactid acid  Date of notification:  05/16/2016  Time of notification:  9pm  Critical value read back: yes  Nurse who received alert: Lonia Farberekha  MD notified (1st page):  MD night coverage  Time of first page: 9.15pm

## 2016-05-16 NOTE — ED Notes (Signed)
Patient transported to X-ray 

## 2016-05-16 NOTE — H&P (Signed)
History and Physical  Patient Name: Ellen Payne     QVZ:[id number]    DOB: December 06, 1935    DOA: 05/16/2016 PCP: Vidal Schwalbe, MD   Patient coming from: Home  Chief Complaint: Back and leg pain and inability to walk  HPI: Ellen Payne is a 80 y.o. female with a past medical history significant for NIDDM, HTN who presents with back pain and leg weakness and fever.  The patient was in her usual state of health until sometime in July, she fell on the stairs, and developed back pain afterwards. Since then she has had progressive low back pain, not improved with over-the-counter analgesics. One week ago she was seen in urgent care, lumbar radiographs were negative, she was diagnosed with UTI because of urinary symptoms and pyuria, started on cephalexin (which she completed, culture grew pan-sensitive E coli). She did have an elevated ESR, and so MRI of the lumbar spine is planned.  Since then her pain has progressively gotten worse.  It is aching in the low back, worse with movement, radiating into both legs.  She has trouble walking because of pain.  She has had no fever, chills, nausea, vomiting.  She has had persistent urinary urgency and urinary frequency.  Per daughter, her husband has witnessed some episodes of bizarre behavior over the last 2 weeks (once being anxious for hours about having "8 cellphones" that she had to recharge).  Today, she went to her PCP who ordered blood work and called her later to tell her to go to the ER because of low sodium, elevated alkaline phosphatase, low potassium, and very low vitamin B12.  ED course: -Afebrile, heart rate 120s, respirations 18, blood pressure pulse oximetry normal -Na 126 (no previous baseline), K 2.8, Cr 0.77 (baseline unknown), WBC 12.2 K, Hgb 10.1 -Magnesium 1.3, TSH normal -Urinalysis nitrites and leukocytes present -Lactic acid 2.11 -Radiographs of the hips, lumbar spine and sacrum showed only degenerative lumbar  disease -Cultures were obtained, ceftriaxone was administered as well as supplemental K and magnesium and TRH were asked to evaluate for admission  Of note, daughter feels that she has had a progressive mental decline over the last year. She lives with her legally blind husband for whom she is the primary caregiver. She still drives, and walks without a cane or walker except for the last 2 weeks when she has been using a walker.    ROS: Review of Systems  Constitutional: Positive for weight loss (12 lbs in last 1-2 months). Negative for chills and fever.  Respiratory: Negative for cough, sputum production and shortness of breath.   Gastrointestinal: Negative for abdominal pain, diarrhea and vomiting.  Genitourinary: Positive for frequency and urgency. Negative for dysuria, flank pain and hematuria.  Musculoskeletal: Positive for back pain.  Neurological: Positive for weakness (bilateral legs).  Psychiatric/Behavioral: Positive for memory loss (gradual over year). The patient is nervous/anxious.   All other systems reviewed and are negative.         Past Medical History:  Diagnosis Date  . Cataract   . Diabetes mellitus without complication ([place])   . Hyperlipidemia   . Hypertension   . Osteoarthritis   . Osteopenia     Past Surgical History:  Procedure Laterality Date  . ABDOMINAL HYSTERECTOMY      Social History: Patient lives with her husband who is legally blind and for whom she is primary caregiver.  The patient walks unassisted at baseline, with a walker in the last two weeks.  She still drives.  She has no previous history of dementia, but family are concerned with her memory over the last year.  She is a never smoker.  She does not use alcohol.    No Known Allergies  Family history: family history includes Heart attack in her son; Hypertension in her mother; Lung cancer in her brother and father.  Prior to Admission medications   Medication Sig Start Date End Date  Taking? Authorizing Provider  acetaminophen (ACETAMINOPHEN 8 HOUR) 650 MG CR tablet Take 650 mg by mouth every 8 (eight) hours. For hip pain. (Daughter states she takes everyday for hip pain)   Yes Historical Provider, MD  aspirin EC 81 MG tablet Take 81 mg by mouth daily.   Yes Historical Provider, MD  atorvastatin (LIPITOR) 40 MG tablet Take 40 mg by mouth at bedtime.   Yes Historical Provider, MD  Cholecalciferol (VITAMIN D3) 5000 units TABS Take 1 tablet by mouth daily.   Yes Historical Provider, MD  lisinopril-hydrochlorothiazide (PRINZIDE,ZESTORETIC) 20-25 MG per tablet Take 1 tablet by mouth daily.   Yes Historical Provider, MD  metFORMIN (GLUCOPHAGE) 500 MG tablet Take 1,000 mg by mouth 2 (two) times daily with a meal.    Yes Historical Provider, MD  cephALEXin (KEFLEX) 500 MG capsule Take 1 capsule (500 mg total) by mouth 2 (two) times daily. Patient not taking: Reported on 05/16/2016 05/06/16   Wendie Agreste, MD       Physical Exam: BP 104/73   Pulse 89   Temp 98.2 F (36.8 C) (Oral)   Resp 18   Ht 5' 6"  (1.676 m)   Wt 58.1 kg (128 lb)   SpO2 96%   BMI 20.66 kg/m  General appearance: Thin elderly female, alert and in no acute distress.   Eyes: Anicteric, conjunctiva pink, lids and lashes normal. PERRL.    ENT: No nasal deformity, discharge, epistaxis.  Hearing normal. OP very dry without lesions.   Neck: No neck masses.  Trachea midline.  No thyromegaly/tenderness. Lymph: No cervical or supraclavicular lymphadenopathy. Skin: Warm and dry.  No jaundice.  No suspicious rashes or lesions. Cardiac: RRR, nl S1-S2, no murmurs appreciated.  Capillary refill is brisk.  JVP normal.  No LE edema.  Radial and DP pulses 2+ and symmetric. Respiratory: Normal respiratory rate and rhythm.  CTAB without rales or wheezes. Abdomen: Abdomen soft.  No TTP. No ascites, distension, hepatosplenomegaly.   MSK: No deformities or effusions.  No cyanosis or clubbing. Neuro: Cranial nerves normal.   Sensation intact to light touch. Speech is fluent.  Muscle strength 5 over 5 in upper and lower extremities bilaterally.  ?great toe dorsiflexion diminished on LEFT. Reflexes 2+ and symmetric at the knees. Psych: Sensorium intact and responding to questions, attention diminshed.  Behavior slightly anxious.  Affect anxious.  Judgment and insight appear impaired.     Labs on Admission:  I have personally reviewed following labs and imaging studies: CBC:  Recent Labs Lab 05/16/16 0201  WBC 12.2*  NEUTROABS 10.4*  HGB 11.1*  HCT 33.3*  MCV 92.0  PLT 161*   Basic Metabolic Panel:  Recent Labs Lab 05/16/16 0201 05/16/16 0319  NA 126*  --   K 2.8*  --   CL 84*  --   CO2 28  --   GLUCOSE 183*  --   BUN 23*  --   CREATININE 0.77  --   CALCIUM 8.7*  --   MG  --  1.3*   GFR:  Estimated Creatinine Clearance (by C-G formula based on SCr of 0.8 mg/dL) Female: 51.9 mL/min Female: 61 mL/min  Liver Function Tests:  Recent Labs Lab 05/16/16 0201  AST 36  ALT 17  ALKPHOS 154*  BILITOT 0.8  PROT 5.9*  ALBUMIN 3.1*   Thyroid Function Tests:  Recent Labs  05/16/16 0201  TSH 1.396   Sepsis Labs: Lactic acid level 2.11   Invalid input(s): PROCALCITONIN, LACTICIDVEN Recent Results (from the past 240 hour(s))  Urine culture     Status: None   Collection Time: 05/06/16  9:44 AM  Result Value Ref Range Status   Culture ESCHERICHIA COLI  Final   Colony Count >=100,000 COLONIES/ML  Final   Organism ID, Bacteria ESCHERICHIA COLI  Final      Susceptibility   Escherichia coli -  (no method available)    AMPICILLIN 8 Sensitive     AMOX/CLAVULANIC 4 Sensitive     AMPICILLIN/SULBACTAM <=2 Sensitive     PIP/TAZO <=4 Sensitive     IMIPENEM <=0.25 Sensitive     CEFAZOLIN <=4 Not Reportable     CEFTRIAXONE <=1 Sensitive     CEFTAZIDIME <=1 Sensitive     CEFEPIME <=1 Sensitive     GENTAMICIN <=1 Sensitive     TOBRAMYCIN <=1 Sensitive     CIPROFLOXACIN <=0.25 Sensitive      LEVOFLOXACIN <=0.12 Sensitive     NITROFURANTOIN <=16 Sensitive     TRIMETH/SULFA* <=20 Sensitive      * NR=NOT REPORTABLE,SEE COMMENTORAL therapy:A cefazolin MIC of <32 predicts susceptibility to the oral agents cefaclor,cefdinir,cefpodoxime,cefprozil,cefuroxime,cephalexin,and loracarbef when used for therapy of uncomplicated UTIs due to E.coli,K.pneumomiae,and P.mirabilis. PARENTERAL therapy: A cefazolinMIC of >8 indicates resistance to parenteralcefazolin. An alternate test method must beperformed to confirm susceptibility to parenteralcefazolin.         Radiological Exams on Admission: Personally reviewed: Dg Lumbar Spine Complete  Result Date: 05/16/2016 CLINICAL DATA:  Bilateral hip, lower lumbosacral and sacral pain for 1 week. Progressive pain. No known injury. EXAM: LUMBAR SPINE - COMPLETE 4+ VIEW COMPARISON:  Radiographs 05/06/2016 FINDINGS: The degree of anterolisthesis of L5 on S1 is unchanged measuring 8 mm. There is associated disc space narrowing and facet arthropathy. Unchanged facet arthropathy throughout the lumbar spine additional levels. Suggestion of mild superior endplate concavity of L3 is unchanged. Milder disc space narrowing and endplate spurring at U2-P5 and T12-L1. No acute fracture. Sclerotic focus noted in the sacrum. Atherosclerosis of the intra-abdominal vasculature. IMPRESSION: Stable degenerative change from radiographs 10 days prior. This is most prominent at L5-S1 with degenerative disc disease, facet arthropathy, and anterolisthesis. No acute bony abnormality. Electronically Signed   By: Jeb Levering M.D.   On: 05/16/2016 02:45   Dg Sacrum/coccyx  Result Date: 05/16/2016 CLINICAL DATA:  Bilateral hip, lower lumbosacral and sacral pain for 1 week. Progressive pain. No known injury. EXAM: SACRUM AND COCCYX - 2+ VIEW COMPARISON:  None. FINDINGS: There is no evidence of fracture. Sacral ala are maintained. Sclerotic density within the mid sacrum, favored to be  benign bone island. Sacroiliac joints are congruent. IMPRESSION: No acute bony abnormality. Sclerotic focus in the mid sacrum, likely a benign bone island in the absence of known malignancy. Electronically Signed   By: Jeb Levering M.D.   On: 05/16/2016 02:40   Dg Hips Bilat W Or Wo Pelvis 3-4 Views  Result Date: 05/16/2016 CLINICAL DATA:  Bilateral hip, lower lumbosacral and sacral pain for 1 week. Progressive pain. No known injury. EXAM: DG HIP (WITH OR  WITHOUT PELVIS) 3-4V BILAT COMPARISON:  No prior hyper pelvis radiograph. FINDINGS: The cortical margins of the bony pelvis and both hips are intact. No fracture. Pubic symphysis and sacroiliac joints are congruent. Both femoral heads are well-seated in the respective acetabula. Scattered sclerotic densities over the sacrum both iliac bones are nonspecific, favored benign bone islands. IMPRESSION: No acute bony abnormality to explain bilateral hip pain. Electronically Signed   By: Jeb Levering M.D.   On: 05/16/2016 02:37    EKG: Independently reviewed. Rate 115, QTc 509, sinus rhythm, no ST changes.    Assessment/Plan 1. Sepsis from UTI:  Suspected source UTI. Organism unknown. Patient meets criteria given tachycardia, tachypnea, leukocytosis, and evidence of organ dysfunction.  Lactate 2.11 mmol/L and repeat ordered within 6 hours.  Antibiotics delivered in the ED.    -Sepsis bundle utilized:  -Blood and urine cultures drawn  -Start targeted antibiotics with ceftriaxone, based on suspected source of infection    -Repeat renal function and complete blood count in AM  -Code SEPSIS called to E-link  -CT abdomen and pelvis with contrast to rule out abscess or renal stone       2. Back pain and leg weakness: -CT abdomen and pelvis is ordered -Check ESR, if elevated, will obtain MRI lumbar spine  3. Hypokalemia:  Repleted in ER. -Potassium 20 mEq oral BID -Repeat BMP   4. Hyponatremia:  Appears dehydrated.   -Will given 1L NS  now to rehydrate initially -Repeat BMP in 6 hours -Check urine and serum osmolality  5. Hypomagnesemia:  Repleted.  6. Anemia, normocytic:  No previous for comparison. -Check iron studies -Check B12  7. HTN:  -Hold HCTZ -Hold lisinopril until hemodynamics clearer -Continue statin and aspirin  8. NIDDM:  -Hold metformin  -SSI with meals  9. Prolonged QTc: -Monitor on telemetry -Correct electrolytes -Repeat ECG tomorrow  10. Yeast on urine specimen: -Fluconazole per EDP    DVT prophylaxis: Lovenox  Code Status: FULL  Family Communication: Daughter at bedside.  Diagnosis was discussed, an opportunity for questions was given and all questions were answered.   CODE STATUS confirmed. Disposition Plan: Anticipate IV antibiotics and following culture data.  Close monitoring of electrolytes. Consults called: None Admission status: INPATIENT, telemetry    Medical decision making: Patient seen at 5:20 AM on 05/16/2016.  The patient was discussed with Dr. Kathrynn Humble.  What exists of the patient's chart was reviewed in depth and summarized above.  Clinical condition: stable heart rate and respirations at my evaluation.        Edwin Dada [facility] Pager [phone]

## 2016-05-16 NOTE — Progress Notes (Signed)
Pharmacy Antibiotic Note  [name] is a 80 y.o. female admitted on 05/16/2016 with UTI.  Pharmacy has been consulted for Rocephin dosing.  Plan: Rocephin 1g IV Q24H.  Pharmacy will sign off.  Height: 5\' 6"  (167.6 cm) Weight: 125 lb (56.7 kg) IBW/kg (Calculated) : 59.3  Temp (24hrs), Avg:98.2 F (36.8 C), Min:98.2 F (36.8 C), Max:98.2 F (36.8 C)   Recent Labs Lab 05/16/16 0201 05/16/16 0330  WBC 12.2*  --   CREATININE 0.77  --   LATICACIDVEN  --  2.11*    Estimated Creatinine Clearance (by C-G formula based on SCr of 0.8 mg/dL) Female: 84.651.9 mL/min Female: 61 mL/min    No Known Allergies   Thank you for allowing pharmacy to be a part of this patient's care.  [name], PharmD, BCPS  05/16/2016 6:46 AM

## 2016-05-16 NOTE — ED Notes (Signed)
Admitting MD at the bedside.  

## 2016-05-16 NOTE — ED Notes (Signed)
Pt states she is unable to stay siting up or stand up for getting this VS done.

## 2016-05-16 NOTE — ED Provider Notes (Signed)
[facility]-EMERGENCY DEPT Provider Note   CSN: [id number] Arrival date & time: 05/15/16  2118  By signing my name below, I, Octavia Heir, attest that this documentation has been prepared under the direction and in the presence of Derwood Kaplan, MD.  Electronically Signed: Octavia Heir, ED Scribe. 05/16/16. 1:48 AM.   History   Chief Complaint Chief Complaint  Patient presents with  . Hip Pain  . Knee Pain   The history is provided by the patient. No language interpreter was used.   HPI Comments: Ellen Payne is a 80 y.o. female who has a PMhx of DM, HLD, HTN, and osteoarthritis presents to the Emergency Department complaining of sudden onset, gradual worsening, bilateral hip and knee pain onset x 2 weeks. Daughter notes pt has developed balance issues over the past year. Pt expresses feeling dizzy upon standing. She ambulates with a cane but has been ambulating with a walker lately. She reports her decline in her ability to walk is due to pain in both of her knees and her tailbone. Pt has fallen twice in the past 8 months. She notes that she ambulates very slowly and carefully due to increase in not feeling "balanced" when ambulating. Pt was evaluated by her PCP yesterday and was told to follow up with [facility]. It is noted pt's sodium levels were 125. She is currently on Lisinopril htcz. She denies hx of cancer. There are no injuries noted.  PCP: Cala Bradford, MD  Past Medical History:  Diagnosis Date  . Anemia 05/2016  . Cataract   . Diabetes mellitus without complication (HCC)   . Hyperlipidemia   . Hypertension   . Hyponatremia 05/2016  . Osteoarthritis   . Osteopenia     Patient Active Problem List   Diagnosis Date Noted  . Malnutrition of moderate degree 05/19/2016  . Encephalopathy, metabolic   . Lower back pain   . Prolonged QT interval 05/16/2016  . Hypomagnesemia 05/16/2016  . Hypokalemia 05/16/2016  . Hyponatremia 05/16/2016  . Acute hyponatremia  05/16/2016  . Sepsis (HCC) 05/16/2016  . UTI (lower urinary tract infection) 05/16/2016  . Anemia, unspecified 05/16/2016  . Type 2 diabetes mellitus without complication, without long-term current use of insulin (HCC) 05/16/2016  . Essential hypertension 05/16/2016    Past Surgical History:  Procedure Laterality Date  . ABDOMINAL HYSTERECTOMY      OB History    No data available       Home Medications    Prior to Admission medications   Medication Sig Start Date End Date Taking? Authorizing Provider  aspirin EC 81 MG tablet Take 81 mg by mouth daily.   Yes Historical Provider, MD  atorvastatin (LIPITOR) 40 MG tablet Take 40 mg by mouth at bedtime.   Yes Historical Provider, MD  Cholecalciferol (VITAMIN D3) 5000 units TABS Take 1 tablet by mouth daily.   Yes Historical Provider, MD  metFORMIN (GLUCOPHAGE) 500 MG tablet Take 1,000 mg by mouth 2 (two) times daily with a meal.    Yes Historical Provider, MD  acetaminophen (TYLENOL) 325 MG tablet Take 2 tablets (650 mg total) by mouth every 6 (six) hours as needed for mild pain (or Fever >/= 101). 05/22/16   Leana Roe Elgergawy, MD  cyanocobalamin (,VITAMIN B-12,) 1000 MCG/ML injection Please take once weekly every Monday for 4 weeks, then change to once monthly. 05/22/16   Leana Roe Elgergawy, MD  feeding supplement, GLUCERNA SHAKE, (GLUCERNA SHAKE) LIQD Take 237 mLs by mouth 2 (two) times  daily between meals. 05/23/16   Leana Roe Elgergawy, MD  lisinopril (PRINIVIL,ZESTRIL) 20 MG tablet Take 1 tablet (20 mg total) by mouth daily. 05/23/16   Leana Roe Elgergawy, MD  sodium chloride 1 g tablet Take 1 tablet (1 g total) by mouth 2 (two) times daily with a meal. 05/22/16   Starleen Arms, MD    Family History Family History  Problem Relation Age of Onset  . Hypertension Mother   . Lung cancer Father   . Lung cancer Brother   . Heart attack Son     Social History Social History  Substance Use Topics  . Smoking status: Never Smoker    . Smokeless tobacco: Never Used  . Alcohol use No     Allergies   Review of patient's allergies indicates no known allergies.   Review of Systems Review of Systems  A complete 10 system review of systems was obtained and all systems are negative except as noted in the HPI and PMH.   Physical Exam Updated Vital Signs BP (!) 164/68 (BP Location: Right Arm)   Pulse (!) 104   Temp 98.1 F (36.7 C) (Oral)   Resp 18   Ht 5\' 6"  (1.676 m)   Wt 128 lb 8 oz (58.3 kg)   SpO2 97%   BMI 20.74 kg/m   Physical Exam  Constitutional: She is oriented to person, place, and time. She appears well-developed and well-nourished. No distress.  HENT:  Head: Normocephalic and atraumatic.  Eyes: EOM are normal.  Pupils are 4mm and reactive to light No vertical or horizontal nystagmus  Neck: Normal range of motion. No JVD present.  Cardiovascular: Regular rhythm.  Tachycardia present.   Murmur (systolic) heard. Pulmonary/Chest: Effort normal and breath sounds normal.  Lungs clear on anterior ausculation  Abdominal: Soft. She exhibits no distension. There is no tenderness.  Musculoskeletal: Normal range of motion. She exhibits tenderness.  Pelvis is stable however pt has tenderness over hip joint bilaterally Lower extremity strength 4/5 bilaterally Pt has tenderness over lower lumbar spine and sacral region, no focal tenderness   Neurological: She is alert and oriented to person, place, and time.  Skin: Skin is warm and dry.  Psychiatric: She has a normal mood and affect. Judgment normal.  Nursing note and vitals reviewed.    ED Treatments / Results  DIAGNOSTIC STUDIES: Oxygen Saturation is 100% on RA, normal by my interpretation.  COORDINATION OF CARE:  1:44 AM Discussed treatment plan with pt at bedside and pt agreed to plan.  Labs (all labs ordered are listed, but only abnormal results are displayed) Labs Reviewed  URINE CULTURE - Abnormal; Notable for the following:        Result Value   Culture MULTIPLE SPECIES PRESENT, SUGGEST RECOLLECTION (*)    All other components within normal limits  CBC WITH DIFFERENTIAL/PLATELET - Abnormal; Notable for the following:    WBC 12.2 (*)    RBC 3.62 (*)    Hemoglobin 11.1 (*)    HCT 33.3 (*)    Platelets 655 (*)    Neutro Abs 10.4 (*)    All other components within normal limits  URINALYSIS, ROUTINE W REFLEX MICROSCOPIC (NOT AT [facility]) - Abnormal; Notable for the following:    Color, Urine AMBER (*)    APPearance CLOUDY (*)    Specific Gravity, Urine >1.046 (*)    Hgb urine dipstick SMALL (*)    Protein, ur 100 (*)    Nitrite POSITIVE (*)  Leukocytes, UA SMALL (*)    All other components within normal limits  COMPREHENSIVE METABOLIC PANEL - Abnormal; Notable for the following:    Sodium 126 (*)    Potassium 2.8 (*)    Chloride 84 (*)    Glucose, Bld 183 (*)    BUN 23 (*)    Calcium 8.7 (*)    Total Protein 5.9 (*)    Albumin 3.1 (*)    Alkaline Phosphatase 154 (*)    All other components within normal limits  URINE MICROSCOPIC-ADD ON - Abnormal; Notable for the following:    Squamous Epithelial / LPF 6-30 (*)    Bacteria, UA MANY (*)    All other components within normal limits  MAGNESIUM - Abnormal; Notable for the following:    Magnesium 1.3 (*)    All other components within normal limits  VITAMIN B12 - Abnormal; Notable for the following:    Vitamin B-12 66 (*)    All other components within normal limits  LACTIC ACID, PLASMA - Abnormal; Notable for the following:    Lactic Acid, Venous 2.8 (*)    All other components within normal limits  SEDIMENTATION RATE - Abnormal; Notable for the following:    Sed Rate 26 (*)    All other components within normal limits  GLUCOSE, CAPILLARY - Abnormal; Notable for the following:    Glucose-Capillary 164 (*)    All other components within normal limits  BASIC METABOLIC PANEL - Abnormal; Notable for the following:    Sodium 131 (*)    Potassium 3.0 (*)     Chloride 88 (*)    Glucose, Bld 124 (*)    All other components within normal limits  GLUCOSE, CAPILLARY - Abnormal; Notable for the following:    Glucose-Capillary 135 (*)    All other components within normal limits  FOLATE RBC - Abnormal; Notable for the following:    Hematocrit 30.0 (*)    All other components within normal limits  CBC - Abnormal; Notable for the following:    WBC 10.6 (*)    RBC 3.38 (*)    Hemoglobin 10.3 (*)    HCT 31.5 (*)    Platelets 574 (*)    All other components within normal limits  BASIC METABOLIC PANEL - Abnormal; Notable for the following:    Sodium 129 (*)    Chloride 91 (*)    Glucose, Bld 123 (*)    Calcium 8.8 (*)    All other components within normal limits  GLUCOSE, CAPILLARY - Abnormal; Notable for the following:    Glucose-Capillary 175 (*)    All other components within normal limits  LACTIC ACID, PLASMA - Abnormal; Notable for the following:    Lactic Acid, Venous 2.2 (*)    All other components within normal limits  GLUCOSE, CAPILLARY - Abnormal; Notable for the following:    Glucose-Capillary 186 (*)    All other components within normal limits  GLUCOSE, CAPILLARY - Abnormal; Notable for the following:    Glucose-Capillary 159 (*)    All other components within normal limits  GLUCOSE, CAPILLARY - Abnormal; Notable for the following:    Glucose-Capillary 272 (*)    All other components within normal limits  GLUCOSE, CAPILLARY - Abnormal; Notable for the following:    Glucose-Capillary 191 (*)    All other components within normal limits  BASIC METABOLIC PANEL - Abnormal; Notable for the following:    Sodium 129 (*)    Chloride 92 (*)  Glucose, Bld 253 (*)    Calcium 8.8 (*)    All other components within normal limits  MAGNESIUM - Abnormal; Notable for the following:    Magnesium 1.5 (*)    All other components within normal limits  GLUCOSE, CAPILLARY - Abnormal; Notable for the following:    Glucose-Capillary 178 (*)      All other components within normal limits  GLUCOSE, CAPILLARY - Abnormal; Notable for the following:    Glucose-Capillary 217 (*)    All other components within normal limits  GLUCOSE, CAPILLARY - Abnormal; Notable for the following:    Glucose-Capillary 185 (*)    All other components within normal limits  GLUCOSE, CAPILLARY - Abnormal; Notable for the following:    Glucose-Capillary 202 (*)    All other components within normal limits  CBC - Abnormal; Notable for the following:    RBC 3.80 (*)    Hemoglobin 11.7 (*)    HCT 35.9 (*)    Platelets 538 (*)    All other components within normal limits  BASIC METABOLIC PANEL - Abnormal; Notable for the following:    Sodium 128 (*)    Chloride 92 (*)    Glucose, Bld 187 (*)    All other components within normal limits  GLUCOSE, CAPILLARY - Abnormal; Notable for the following:    Glucose-Capillary 168 (*)    All other components within normal limits  GLUCOSE, CAPILLARY - Abnormal; Notable for the following:    Glucose-Capillary 171 (*)    All other components within normal limits  BASIC METABOLIC PANEL - Abnormal; Notable for the following:    Sodium 128 (*)    Chloride 92 (*)    Glucose, Bld 186 (*)    All other components within normal limits  MAGNESIUM - Abnormal; Notable for the following:    Magnesium 1.6 (*)    All other components within normal limits  GLUCOSE, CAPILLARY - Abnormal; Notable for the following:    Glucose-Capillary 229 (*)    All other components within normal limits  GLUCOSE, CAPILLARY - Abnormal; Notable for the following:    Glucose-Capillary 186 (*)    All other components within normal limits  GLUCOSE, CAPILLARY - Abnormal; Notable for the following:    Glucose-Capillary 189 (*)    All other components within normal limits  GLUCOSE, CAPILLARY - Abnormal; Notable for the following:    Glucose-Capillary 193 (*)    All other components within normal limits  GLUCOSE, CAPILLARY - Abnormal; Notable for  the following:    Glucose-Capillary 224 (*)    All other components within normal limits  BASIC METABOLIC PANEL - Abnormal; Notable for the following:    Sodium 129 (*)    Chloride 94 (*)    Glucose, Bld 144 (*)    BUN 23 (*)    GFR calc non Af Amer 60 (*)    All other components within normal limits  GLUCOSE, CAPILLARY - Abnormal; Notable for the following:    Glucose-Capillary 217 (*)    All other components within normal limits  GLUCOSE, CAPILLARY - Abnormal; Notable for the following:    Glucose-Capillary 170 (*)    All other components within normal limits  GLUCOSE, CAPILLARY - Abnormal; Notable for the following:    Glucose-Capillary 209 (*)    All other components within normal limits  BASIC METABOLIC PANEL - Abnormal; Notable for the following:    Sodium 127 (*)    Chloride 94 (*)  Glucose, Bld 187 (*)    BUN 22 (*)    All other components within normal limits  GLUCOSE, CAPILLARY - Abnormal; Notable for the following:    Glucose-Capillary 239 (*)    All other components within normal limits  GLUCOSE, CAPILLARY - Abnormal; Notable for the following:    Glucose-Capillary 231 (*)    All other components within normal limits  GLUCOSE, CAPILLARY - Abnormal; Notable for the following:    Glucose-Capillary 224 (*)    All other components within normal limits  GLUCOSE, CAPILLARY - Abnormal; Notable for the following:    Glucose-Capillary 239 (*)    All other components within normal limits  I-STAT CG4 LACTIC ACID, ED - Abnormal; Notable for the following:    Lactic Acid, Venous 2.11 (*)    All other components within normal limits  CULTURE, BLOOD (ROUTINE X 2)  CULTURE, BLOOD (ROUTINE X 2)  BLOOD CULTURE ID PANEL (REFLEXED)  TSH  OSMOLALITY  FERRITIN  LACTIC ACID, PLASMA  OSMOLALITY, URINE  NA AND K (SODIUM & POTASSIUM), RAND UR  MAGNESIUM    EKG  EKG Interpretation  Date/Time:  Tuesday May 16 2016 02:07:02 EDT Ventricular Rate:  115 PR Interval:      QRS Duration: 74 QT Interval:  351 QTC Calculation: 509 R Axis:   46 Text Interpretation:  Sinus tachycardia Multiform ventricular premature complexes Right atrial enlargement Anteroseptal infarct, old Prolonged QT interval No old tracing to compare Confirmed by Rhunette Croft, MD, Janey Genta [phone]) on 05/16/2016 3:07:24 AM       Radiology No results found.  Procedures Procedures (including critical care time)  Medications Ordered in ED Medications  iopamidol (ISOVUE-300) 61 % injection 15 mL (15 mLs Oral Contrast Given 05/16/16 1048)  cefTRIAXone (ROCEPHIN) 1 g in dextrose 5 % 50 mL IVPB (0 g Intravenous Stopped 05/16/16 0409)  potassium chloride SA (K-DUR,KLOR-CON) CR tablet 60 mEq (60 mEq Oral Given 05/16/16 0314)  magnesium sulfate IVPB 2 g 50 mL (2 g Intravenous New Bag/Given 05/16/16 0535)  fluconazole (DIFLUCAN) tablet 150 mg (150 mg Oral Given 05/16/16 0535)  sodium chloride 0.9 % bolus 1,000 mL (1,000 mLs Intravenous Given 05/16/16 0800)  sodium chloride 0.9 % bolus 500 mL (500 mLs Intravenous Given 05/16/16 1243)  potassium chloride SA (K-DUR,KLOR-CON) CR tablet 20 mEq (20 mEq Oral Given 05/16/16 1536)  iopamidol (ISOVUE-300) 61 % injection (80 mLs  Contrast Given 05/16/16 1350)  sodium chloride 0.9 % bolus 500 mL (500 mLs Intravenous Given 05/16/16 2148)  magnesium sulfate IVPB 2 g 50 mL (2 g Intravenous Given 05/18/16 1111)  magnesium sulfate IVPB 2 g 50 mL (2 g Intravenous Given 05/20/16 1623)     Initial Impression / Assessment and Plan / ED Course  I have reviewed the triage vital signs and the nursing notes.  Pertinent labs & imaging results that were available during my care of the patient were reviewed by me and considered in my medical decision making (see chart for details).  Clinical Course   I personally performed the services described in this documentation, which was scribed in my presence. The recorded information has been reviewed and is accurate.  PT comes in with back  pain and weakness. Has hyponatremia UA is suspicious - with mildly elevated WC and lactate > 2. Unable to ambulate. Will admit to medicine.  Final Clinical Impressions(s) / ED Diagnoses   Final diagnoses:  Acute hyponatremia  UTI (lower urinary tract infection)  Inability to walk  Hypokalemia  Hypomagnesemia  New Prescriptions Discharge Medication List as of 05/22/2016  4:03 PM    START taking these medications   Details  acetaminophen (TYLENOL) 325 MG tablet Take 2 tablets (650 mg total) by mouth every 6 (six) hours as needed for mild pain (or Fever >/= 101)., Starting Mon 05/22/2016, OTC    cyanocobalamin (,VITAMIN B-12,) 1000 MCG/ML injection Please take once weekly every Monday for 4 weeks, then change to once monthly., No Print    feeding supplement, GLUCERNA SHAKE, (GLUCERNA SHAKE) LIQD Take 237 mLs by mouth 2 (two) times daily between meals., Starting Tue 05/23/2016, No Print    lisinopril (PRINIVIL,ZESTRIL) 20 MG tablet Take 1 tablet (20 mg total) by mouth daily., Starting Tue 05/23/2016, No Print    sodium chloride 1 g tablet Take 1 tablet (1 g total) by mouth 2 (two) times daily with a meal., Starting Mon 05/22/2016, No Print         Derwood Kaplan, MD 05/23/16 0000

## 2016-05-17 DIAGNOSIS — M545 Low back pain, unspecified: Secondary | ICD-10-CM

## 2016-05-17 LAB — CBC
HCT: 31.5 % — ABNORMAL LOW (ref 36.0–46.0)
Hemoglobin: 10.3 g/dL — ABNORMAL LOW (ref 12.0–15.0)
MCH: 30.5 pg (ref 26.0–34.0)
MCHC: 32.7 g/dL (ref 30.0–36.0)
MCV: 93.2 fL (ref 78.0–100.0)
PLATELETS: 574 10*3/uL — AB (ref 150–400)
RBC: 3.38 MIL/uL — AB (ref 3.87–5.11)
RDW: 13.8 % (ref 11.5–15.5)
WBC: 10.6 10*3/uL — AB (ref 4.0–10.5)

## 2016-05-17 LAB — URINE CULTURE

## 2016-05-17 LAB — LACTIC ACID, PLASMA: Lactic Acid, Venous: 0.9 mmol/L (ref 0.5–1.9)

## 2016-05-17 LAB — GLUCOSE, CAPILLARY
GLUCOSE-CAPILLARY: 159 mg/dL — AB (ref 65–99)
GLUCOSE-CAPILLARY: 272 mg/dL — AB (ref 65–99)
GLUCOSE-CAPILLARY: 191 mg/dL — AB (ref 65–99)
GLUCOSE-CAPILLARY: 178 mg/dL — AB (ref 65–99)

## 2016-05-17 LAB — BASIC METABOLIC PANEL
ANION GAP: 9 (ref 5–15)
BUN: 16 mg/dL (ref 6–20)
CALCIUM: 8.8 mg/dL — AB (ref 8.9–10.3)
CO2: 29 mmol/L (ref 22–32)
Chloride: 91 mmol/L — ABNORMAL LOW (ref 101–111)
Creatinine, Ser: 0.77 mg/dL (ref 0.44–1.00)
GLUCOSE: 123 mg/dL — AB (ref 65–99)
POTASSIUM: 3.9 mmol/L (ref 3.5–5.1)
Sodium: 129 mmol/L — ABNORMAL LOW (ref 135–145)

## 2016-05-17 LAB — FOLATE RBC
FOLATE, HEMOLYSATE: 324.7 ng/mL
FOLATE, RBC: 1082 ng/mL (ref >498)
Hematocrit: 30 % — ABNORMAL LOW (ref 34.0–46.6)

## 2016-05-17 MED ORDER — ENOXAPARIN SODIUM 40 MG/0.4ML ~~LOC~~ SOLN
40.0000 mg | SUBCUTANEOUS | Status: DC
Start: 2016-05-17 — End: 2016-05-22
  Administered 2016-05-17 – 2016-05-21 (×5): 40 mg via SUBCUTANEOUS
  Filled 2016-05-17 (×4): qty 0.4

## 2016-05-17 MED ORDER — SODIUM CHLORIDE 0.9 % IV SOLN
INTRAVENOUS | Status: DC
  Administered 2016-05-17 – 2016-05-18 (×2): via INTRAVENOUS

## 2016-05-17 NOTE — Progress Notes (Signed)
Initial Nutrition Assessment  DOCUMENTATION CODES:   Non-severe (moderate) malnutrition in context of chronic illness  INTERVENTION:  Ensure Enlive po BID, each supplement provides 350 kcal and 20 grams of protein Provided and discussed "Suggestions for Increasing Calories and Protein" and "Protein Content of Foods List" from the Academy of [employer] and Dietetics.    NUTRITION DIAGNOSIS:   Inadequate oral intake related to poor appetite as evidenced by percent weight loss, moderate depletions of muscle mass, moderate depletion of body fat.   GOAL:   Patient will meet greater than or equal to 90% of their needs   MONITOR:   PO intake, Supplement acceptance, Labs, Weight trends, Skin, I & O's  REASON FOR ASSESSMENT:   Malnutrition Screening Tool    ASSESSMENT:   80 y.o. female with a past medical history significant for NIDDM, HTN who presents with back pain and leg weakness and fever.  Pt reports having a poor appetite for the over one month and a decreased appetite for a few years. Daughter at bedside reports that patient used to weigh 200 lbs 2.5 years ago and started losing weight after her son, sister, and friend passed away. Pt reports eating 3 times per day, but smaller amounts that she used to. She might eat a peanut butter sandwich for breakfast and lunch and a small portion of veggies/rice for dinner. Pt reports maintaining 140 lbs more recently, but losing 15 lbs in the past 2 months. Weight history shows 8% weight loss in the past 2 months. Pt has moderate muscle wasting and moderate fat wasting per nutrition-focused physical exam.  Encouraged increasing PO intake and recommended intake of Glucerna Shakes and Ensure Enlive PRN depending on food intake. Recommended increasing protein intake and reviewed sample menu with adequate protein. Pt feels that she is eating better today than she does at home and she is agreeable to drinking supplements.   Labs: glucose ranging  135 to 186 mg/dL, low sodium, low chloride, low hemoglobin, low vitamin B12  Diet Order:  Diet Carb Modified Fluid consistency: Thin; Room service appropriate? Yes  Skin:  Reviewed, no issues  Last BM:  9/13  Height:   Ht Readings from Last 1 Encounters:  05/16/16 5\' 6"  (1.676 m)    Weight:   Wt Readings from Last 1 Encounters:  05/17/16 129 lb 11.2 oz (58.8 kg)    Ideal Body Weight:  59.09 kg  BMI:  Body mass index is 20.93 kg/m.  Estimated Nutritional Needs:   Kcal:  1700-1900  Protein:  70-80 grams  Fluid:  1.7-1.9 L/day  EDUCATION NEEDS:   Education needs addressed  Dorothea Ogleeanne Taegan Haider RD, CSP, LDN Inpatient Clinical Dietitian Pager: [phone] After Hours Pager: [phone]

## 2016-05-17 NOTE — Evaluation (Signed)
Physical Therapy Evaluation Patient Details Name: Ellen Payne MRN: [id number] DOB: [birth year] Today's Date: 05/17/2016   History of Present Illness  Ellen Payne is a 80 y.o. female with a past medical history significant for NIDDM, HTN who presents with back pain and leg weakness and fever.  Clinical Impression  Pt admitted with above. Pt was indep up until 2 weeks ago. Pt now requires assist for all ADLs, uses RW and quickly fatigues. Pt a increased falls risk. Pt to benefit from ST-SNF upon d/c to achieve mod I function as PTA.    Follow Up Recommendations SNF;Supervision/Assistance - 24 hour (pt prefers [facility])    Equipment Recommendations  None recommended by PT    Recommendations for Other Services       Precautions / Restrictions Precautions Precautions: Fall      Mobility  Bed Mobility Overal bed mobility: Needs Assistance Bed Mobility: Supine to Sit     Supine to sit: Min assist     General bed mobility comments: increased time, max directional v/c's, assit for LE management off bed  Transfers Overall transfer level: Needs assistance Equipment used: Rolling walker (2 wheeled) Transfers: Sit to/from Stand Sit to Stand: Min assist         General transfer comment: v/c's to push up from bed not pull up on walker, increased time, minA to power up, posterior lean  Ambulation/Gait Ambulation/Gait assistance: Min assist Ambulation Distance (Feet): 40 Feet Assistive device: 4-wheeled walker Gait Pattern/deviations: Step-to pattern;Decreased stride length Gait velocity: slow Gait velocity interpretation: Below normal speed for age/gender General Gait Details: increased trunk flexion, minimal foot clearance, onset of fatigue, report of LE weakness  Stairs            Wheelchair Mobility    Modified Rankin (Stroke Patients Only)       Balance Overall balance assessment: Needs assistance Sitting-balance support: Feet  supported;No upper extremity supported Sitting balance-Leahy Scale: Fair     Standing balance support: Bilateral upper extremity supported Standing balance-Leahy Scale: Poor Standing balance comment: requires physical assist                             Pertinent Vitals/Pain Pain Assessment: 0-10 Pain Score: 5  Pain Location: bilat calf Pain Descriptors / Indicators: Aching Pain Intervention(s): Monitored during session    Home Living Family/patient expects to be discharged to:: Skilled nursing facility Living Arrangements: Spouse/significant other               Additional Comments: pt was living with spouse who is legally blind. Pt was amb without AD until 2 weeks, now she requires RW, pt was indep with ADLs, had 1 story home with 5 steps to enter with Bilat HR    Prior Function Level of Independence: Needs assistance   Gait / Transfers Assistance Needed: indep until 2 weeks ago, now needs RW and can only amb short distances  ADL's / Homemaking Assistance Needed: was indep until 2 weeks ago  Comments: was driving     Hand Dominance   Dominant Hand: Right    Extremity/Trunk Assessment   Upper Extremity Assessment: Generalized weakness           Lower Extremity Assessment: Generalized weakness      Cervical / Trunk Assessment: Kyphotic  Communication   Communication: No difficulties  Cognition Arousal/Alertness: Awake/alert Behavior During Therapy: Anxious (self reported) Overall Cognitive Status: Within Functional Limits for tasks  assessed       Memory: Decreased short-term memory (family, reports seeing a steady decline in memory over the y)              General Comments      Exercises        Assessment/Plan    PT Assessment Patient needs continued PT services  PT Diagnosis Difficulty walking;Generalized weakness   PT Problem List Decreased strength;Decreased range of motion;Decreased activity tolerance;Decreased  balance;Decreased mobility  PT Treatment Interventions DME instruction;Gait training;Stair training;Functional mobility training;Therapeutic activities;Therapeutic exercise;Balance training   PT Goals (Current goals can be found in the Care Plan section) Acute Rehab PT Goals Patient Stated Goal: to get stronger and back to indep PT Goal Formulation: With patient/family Time For Goal Achievement: 05/24/16 Potential to Achieve Goals: Good    Frequency Min 3X/week   Barriers to discharge Decreased caregiver support spouse legally blind    Co-evaluation               End of Session Equipment Utilized During Treatment: Gait belt Activity Tolerance: Patient limited by fatigue Patient left: in chair;with call bell/phone within reach;with family/visitor present Nurse Communication: Mobility status         Time: 1500-1534 PT Time Calculation (min) (ACUTE ONLY): 34 min   Charges:   PT Evaluation $PT Eval Moderate Complexity: 1 Procedure PT Treatments $Gait Training: 8-22 mins   PT G CodesMarcene Payne:        Ellen Payne Ellen Payne 05/17/2016, 4:25 PM   Lewis ShockAshly Marymargaret Kirker, PT, DPT Pager #: [phone] Office #: [phone]

## 2016-05-17 NOTE — Progress Notes (Signed)
PROGRESS NOTE    Ellen Payne  ZOX:[id number] DOB: July 23, 1936 DOA: 05/16/2016 PCP: Cala Bradford, MD  Brief Narrative: 80 year old female with history of type 2 diabetes, hypertension who presented with back pain, lower leg weakness and fever. In the ER patient with tachycardia, hyponatremia, leukocytosis, elevated lactate level and positive UTI. Patient is being treated for sepsis due to UTI. She has abnormal MRI finding with a degenerative changes in her lower back screening pain and lower extremity weakness.   Assessment & Plan:   # Presumed Sepsis due to UTI (? Acute cystitis without hematuria): Continue ceftriaxone - follow up urine and blood culture result - CT a/p and chest result reviewed.  -clinically improving.  -I will continue broad spectrum IV antibiotics until I have culture result. Seems like she is responding with current management.   # Lower back pain and b/l leg weakness. MRI lumbar result reviewed, consistent with b/l sacral insufficiency fractures, moderate canal stenosis and chronic degenerative changes.  -Pt, OT evaluation -pt likely needs rehab on discharge.   # Hyponatremia and hypokalemia: likely dehydration. -check urine osmolality and urine electrolytes. -NS @75cc /hr -monitor labs.  # Type 2 diabetes mellitus without complication, without long-term current use of insulin (HCC):Continue current insulin regimen. Monitor blood sugar level.  -metformin on hold.   # HTN, essential: BP acceptable. Holding BP meds.    DVT prophylaxis: lovenox sq Code Status: Full Family Communication: daughter at bedside.  Disposition Plan: Likely discharge to rehab in 1-2 days.   Consultants:   None   Antimicrobials:ceftriaxone.  Subjective: Patient was seen and examined at bedside. Patient reported gradually worsening lower back pain and lower extremity weakness. Denied chest pain, shortness of breath, nausea or vomiting. Patient's daughter at  bedside.   Objective: Vitals:   05/16/16 1702 05/16/16 2040 05/17/16 0425 05/17/16 1208  BP: (!) 114/58 119/60 130/65 122/62  Pulse: 92 89 83 90  Resp: 18 18 (!) 22 20  Temp: 98 F (36.7 C) 98.1 F (36.7 C) 98 F (36.7 C) 98.2 F (36.8 C)  TempSrc: Oral Oral Oral Oral  SpO2: 99% 100% 100% 98%  Weight:   58.8 kg (129 lb 11.2 oz)   Height:        Intake/Output Summary (Last 24 hours) at 05/17/16 1628 Last data filed at 05/17/16 1350  Gross per 24 hour  Intake             1609 ml  Output             1603 ml  Net                6 ml   Filed Weights   05/15/16 2152 05/16/16 0638 05/17/16 0425  Weight: 58.1 kg (128 lb) 56.7 kg (125 lb) 58.8 kg (129 lb 11.2 oz)    Examination:  General exam: Appears calm and comfortable, dry oral mucosa Respiratory system: Clear to auscultation. Respiratory effort normal. Cardiovascular system: S1 & S2 heard, RRR.No pedal edema. Gastrointestinal system: Abdomen is nondistended, soft and nontender. Normal bowel sounds heard. Central nervous system: Alert and oriented. No focal neurological deficits. Extremities: Symmetric 5 x 5 power. Skin: No rashes, lesions or ulcers Psychiatry: Judgement and insight appear normal. Mood & affect appropriate.     Data Reviewed: I have personally reviewed following labs and imaging studies  CBC:  Recent Labs Lab 05/16/16 0201 05/16/16 1603 05/17/16 0030  WBC 12.2*  --  10.6*  NEUTROABS 10.4*  --   --  HGB 11.1*  --  10.3*  HCT 33.3* 30.0* 31.5*  MCV 92.0  --  93.2  PLT 655*  --  574*   Basic Metabolic Panel:  Recent Labs Lab 05/16/16 0201 05/16/16 0319 05/16/16 1129 05/17/16 0030  NA 126*  --  131* 129*  K 2.8*  --  3.0* 3.9  CL 84*  --  88* 91*  CO2 28  --  31 29  GLUCOSE 183*  --  124* 123*  BUN 23*  --  15 16  CREATININE 0.77  --  0.73 0.77  CALCIUM 8.7*  --  9.0 8.8*  MG  --  1.3*  --   --    GFR: Estimated Creatinine Clearance (by C-G formula based on SCr of 0.77  mg/dL) Female: 16.1 mL/min Female: 63.3 mL/min Liver Function Tests:  Recent Labs Lab 05/16/16 0201  AST 36  ALT 17  ALKPHOS 154*  BILITOT 0.8  PROT 5.9*  ALBUMIN 3.1*   No results for input(s): LIPASE, AMYLASE in the last 168 hours. No results for input(s): AMMONIA in the last 168 hours. Coagulation Profile: No results for input(s): INR, PROTIME in the last 168 hours. Cardiac Enzymes: No results for input(s): CKTOTAL, CKMB, CKMBINDEX, TROPONINI in the last 168 hours. BNP (last 3 results) No results for input(s): PROBNP in the last 8760 hours. HbA1C: No results for input(s): HGBA1C in the last 72 hours. CBG:  Recent Labs Lab 05/16/16 1118 05/16/16 1716 05/16/16 2055 05/17/16 0540 05/17/16 1126  GLUCAP 135* 175* 186* 159* 272*   Lipid Profile: No results for input(s): CHOL, HDL, LDLCALC, TRIG, CHOLHDL, LDLDIRECT in the last 72 hours. Thyroid Function Tests:  Recent Labs  05/16/16 0201  TSH 1.396   Anemia Panel:  Recent Labs  05/16/16 1129  VITAMINB12 66*  FERRITIN 93   Sepsis Labs:  Recent Labs Lab 05/16/16 0330 05/16/16 1129 05/16/16 2009 05/17/16 0030  LATICACIDVEN 2.11* 2.8* 2.2* 0.9    Recent Results (from the past 240 hour(s))  Culture, Urine     Status: Abnormal   Collection Time: 05/16/16  2:08 AM  Result Value Ref Range Status   Specimen Description URINE, RANDOM  Final   Special Requests NONE  Final   Culture MULTIPLE SPECIES PRESENT, SUGGEST RECOLLECTION (A)  Final   Report Status 05/17/2016 FINAL  Final  Blood culture (routine x 2)     Status: None (Preliminary result)   Collection Time: 05/16/16  3:24 AM  Result Value Ref Range Status   Specimen Description BLOOD LEFT ARM  Final   Special Requests IN PEDIATRIC BOTTLE  Final   Culture NO GROWTH 1 DAY  Final   Report Status PENDING  Incomplete  Blood culture (routine x 2)     Status: None (Preliminary result)   Collection Time: 05/16/16  3:25 AM  Result Value Ref Range Status    Specimen Description BLOOD RIGHT FOREARM  Final   Special Requests BOTTLES DRAWN AEROBIC AND ANAEROBIC  Final   Culture NO GROWTH 1 DAY  Final   Report Status PENDING  Incomplete         Radiology Studies: Dg Lumbar Spine Complete  Result Date: 05/16/2016 CLINICAL DATA:  Bilateral hip, lower lumbosacral and sacral pain for 1 week. Progressive pain. No known injury. EXAM: LUMBAR SPINE - COMPLETE 4+ VIEW COMPARISON:  Radiographs 05/06/2016 FINDINGS: The degree of anterolisthesis of L5 on S1 is unchanged measuring 8 mm. There is associated disc space narrowing and facet arthropathy.  Unchanged facet arthropathy throughout the lumbar spine additional levels. Suggestion of mild superior endplate concavity of L3 is unchanged. Milder disc space narrowing and endplate spurring at L1-L2 and T12-L1. No acute fracture. Sclerotic focus noted in the sacrum. Atherosclerosis of the intra-abdominal vasculature. IMPRESSION: Stable degenerative change from radiographs 10 days prior. This is most prominent at L5-S1 with degenerative disc disease, facet arthropathy, and anterolisthesis. No acute bony abnormality. Electronically Signed   By: Rubye OaksMelanie  Ehinger M.D.   On: 05/16/2016 02:45   Dg Sacrum/coccyx  Result Date: 05/16/2016 CLINICAL DATA:  Bilateral hip, lower lumbosacral and sacral pain for 1 week. Progressive pain. No known injury. EXAM: SACRUM AND COCCYX - 2+ VIEW COMPARISON:  None. FINDINGS: There is no evidence of fracture. Sacral ala are maintained. Sclerotic density within the mid sacrum, favored to be benign bone island. Sacroiliac joints are congruent. IMPRESSION: No acute bony abnormality. Sclerotic focus in the mid sacrum, likely a benign bone island in the absence of known malignancy. Electronically Signed   By: Rubye OaksMelanie  Ehinger M.D.   On: 05/16/2016 02:40   Ct Chest W Contrast  Addendum Date: 05/16/2016   ADDENDUM REPORT: 05/16/2016 19:41 ADDENDUM: Mildly ectatic ascending thoracic aorta.  Recommend annual imaging followup by CTA or MRA. This recommendation follows 2010 ACCF/AHA/AATS/ACR/ASA/SCA/SCAI/SIR/STS/SVM Guidelines for the Diagnosis and Management of Patients with Thoracic Aortic Disease. Circulation. 2010; 121: Z610-R604: [id number] Electronically Signed   By: Sebastian AcheAllen  Grady M.D.   On: 05/16/2016 19:41   Result Date: 05/16/2016 CLINICAL DATA:  80 pound weight loss over 1 year. Bilateral hip pain. EXAM: CT CHEST, ABDOMEN, AND PELVIS WITH CONTRAST TECHNIQUE: Multidetector CT imaging of the chest, abdomen and pelvis was performed following the standard protocol during bolus administration of intravenous contrast. CONTRAST:  80mL ISOVUE-300 IOPAMIDOL (ISOVUE-300) INJECTION 61% COMPARISON:  None. FINDINGS: CT CHEST FINDINGS Cardiovascular: Mild thoracic aortic atherosclerosis. Ascending thoracic aorta measures up to 4.0 cm in diameter. Three-vessel coronary artery atherosclerosis. Heart size is within normal limits. There is a small pericardial effusion. Mediastinum/Nodes: Diffusely heterogeneous thyroid with small nodules measuring up to 1.3 cm in size. No enlarged axillary, mediastinal, or hilar lymph nodes are identified. Lungs/Pleura: No pleural effusion. Major airways are patent. Partially calcified right upper lobe nodule measures 6 mm (series 205, image 30). A 2 mm calcified nodule is noted in the left lower lobe (series 25, image 76). There is mild respiratory motion artifact with mild dependent atelectasis in the lower lobes. Musculoskeletal: No chest wall mass or suspicious bone lesions identified. Thoracic spondylosis. Slight depression of the T4 and T7 superior endplates with subjacent osseous sclerosis. CT ABDOMEN PELVIS FINDINGS Hepatobiliary: Mildly decreased attenuation of the liver diffusely suggestive of steatosis, with likely small areas of more focal fatty deposition at the gallbladder fossa. Mildly distended gallbladder without evidence of wall thickening, pericholecystic inflammatory  change, or biliary dilatation. Pancreas: 1.9 x 1.2 cm low-density/cystic lesion in the pancreatic head. No pancreatic ductal dilatation or atrophy. No peripancreatic inflammatory change. Spleen: Unremarkable. Adrenals/Urinary Tract: Adrenal glands are unremarkable. 1.4 cm right upper pole renal cyst. No hydronephrosis or renal calculi. Unremarkable bladder. Stomach/Bowel: Mild gaseous distention of the stomach without evidence of wall thickening. Oral contrast is present in multiple nondilated loops of small bowel. There is no evidence of bowel obstruction or inflammatory process. A moderate amount of stool is present throughout the colon. The appendix is unremarkable. Vascular/Lymphatic: Abdominal aortic atherosclerosis without aneurysm. No enlarged lymph nodes are identified. Reproductive: Status post hysterectomy. No adnexal masses. Other: No intraperitoneal free  fluid.  No abdominal wall hernia. Musculoskeletal: Old posterior left twelfth rib fracture. Old displaced right L1 transverse process fracture. Deformity of the left L2 transverse process consistent with old fracture. Mildly angulated left L3 transverse process fracture and slightly distracted right L4 transverse process fracture, both of indeterminate age. Old left L4 transverse process fracture. Likely bilateral L5 transverse process fractures of indeterminate age. L3 superior endplate Schmorl's node deformity. Grade 1 anterolisthesis of L4 on L5 and L5 on S1, facet mediated. Bilateral L5-S1 facet ankylosis. Bilateral sacral insufficiency type fractures of indeterminate acuity though potentially recent/ unhealed. 1.5 cm densely sclerotic focus in the S3 segment, nonspecific but may represent a bone island. IMPRESSION: 1. No evidence of malignancy in the chest. 2. 1.9 cm cystic pancreatic head lesion, indeterminate. Consider follow-up contrast-enhanced abdominal MRI or pancreatic protocol CT in 2 years to assess stability. 3. Multiple transverse process  fractures in the lumbar spine, many of which are chronic although some are of indeterminate age. 4. Bilateral sacral insufficiency fractures. 5. Mild T4 and T7 superior endplate fracture deformities of indeterminate age. 6. Small pericardial effusion. 7. Hepatic steatosis. 8. Aortic atherosclerosis. Electronically Signed: By: Sebastian Ache M.D. On: 05/16/2016 14:40   Mr Lumbar Spine Wo Contrast  Result Date: 05/16/2016 CLINICAL DATA:  Initial evaluation for acute back pain, inability to walk. EXAM: MRI LUMBAR SPINE WITHOUT CONTRAST TECHNIQUE: Multiplanar, multisequence MR imaging of the lumbar spine was performed. No intravenous contrast was administered. COMPARISON:  Prior CT from earlier the same day. FINDINGS: Segmentation: Normal segmentation. Lowest well-formed disc is labeled the L5-S1 level. Alignment: 5 mm anterolisthesis of L5 on S1. Trace 2 mm anterolisthesis of L4 on L5. Exaggeration of the normal lumbar lordosis. Vertebrae: Abnormal T1 hypo intense, T2/STIR hyperintense signal intensity present within the bilateral sacral ala, compatible with acute insufficiency fractures. Chronic height loss at the superior endplate of L3. Vertebral body heights otherwise maintained. No acute vertebral body fracture. Signal intensity within the vertebral body bone marrow otherwise normal. No worrisome osseous lesions. Conus medullaris: Extends to the L2 level and appears normal. Paraspinal and other soft tissues: Paraspinous soft tissues demonstrate no acute abnormality. Fatty atrophy noted within the paraspinous musculature. Few scattered T2 hyperintense cyst noted within the kidneys. Disc levels: T12-L1:Diffuse degenerative disc bulge with disc desiccation and intervertebral disc space narrowing. No significant stenosis. L1-2: Diffuse disc bulge with disc desiccation. No focal disc herniation. No significant stenosis. L2-3: Diffuse degenerative disc bulge with disc desiccation. No focal disc protrusion. Mild facet  and ligamentum flavum hypertrophy. Mild canal and bilateral foraminal narrowing. L3-4: Mild diffuse disc bulge with disc desiccation. No focal disc herniation. Mild bilateral facet arthrosis with ligamentum flavum hypertrophy. Resultant mild lateral recess stenosis bilaterally. Mild foraminal narrowing, left slightly worse than right related disc bulge and facet disease. L4-5: Trace 2 mm anterolisthesis of L4 on L5. Associated mild broad-based posterior disc bulge. Severe bilateral facet arthrosis with ligamentum flavum hypertrophy. Resultant moderate canal stenosis. Mild bilateral foraminal narrowing related disc bulge and facet disease. L5-S1: 5 mm anterolisthesis of L5 on S1. Associated mild disc bulge with moderate bilateral facet arthrosis. No significant canal or foraminal stenosis. IMPRESSION: 1. Acute bilateral sacral insufficiency fractures. 2. No other acute abnormality within the lumbar spine. No evidence for cord compression. 3. 2 mm anterolisthesis of L4 on L5 with associated severe bilateral facet arthropathy, resulting in moderate canal stenosis. 4. Additional more mild multilevel degenerative spondylolysis as above. No other significant canal or foraminal narrowing within the lumbar  spine. Electronically Signed   By: Rise Mu M.D.   On: 05/16/2016 19:51   Ct Abdomen Pelvis W Contrast  Addendum Date: 05/16/2016   ADDENDUM REPORT: 05/16/2016 19:41 ADDENDUM: Mildly ectatic ascending thoracic aorta. Recommend annual imaging followup by CTA or MRA. This recommendation follows 2010 ACCF/AHA/AATS/ACR/ASA/SCA/SCAI/SIR/STS/SVM Guidelines for the Diagnosis and Management of Patients with Thoracic Aortic Disease. Circulation. 2010; 121: T557-D220 Electronically Signed   By: Sebastian Ache M.D.   On: 05/16/2016 19:41   Result Date: 05/16/2016 CLINICAL DATA:  80 pound weight loss over 1 year. Bilateral hip pain. EXAM: CT CHEST, ABDOMEN, AND PELVIS WITH CONTRAST TECHNIQUE: Multidetector CT imaging  of the chest, abdomen and pelvis was performed following the standard protocol during bolus administration of intravenous contrast. CONTRAST:  80mL ISOVUE-300 IOPAMIDOL (ISOVUE-300) INJECTION 61% COMPARISON:  None. FINDINGS: CT CHEST FINDINGS Cardiovascular: Mild thoracic aortic atherosclerosis. Ascending thoracic aorta measures up to 4.0 cm in diameter. Three-vessel coronary artery atherosclerosis. Heart size is within normal limits. There is a small pericardial effusion. Mediastinum/Nodes: Diffusely heterogeneous thyroid with small nodules measuring up to 1.3 cm in size. No enlarged axillary, mediastinal, or hilar lymph nodes are identified. Lungs/Pleura: No pleural effusion. Major airways are patent. Partially calcified right upper lobe nodule measures 6 mm (series 205, image 30). A 2 mm calcified nodule is noted in the left lower lobe (series 25, image 76). There is mild respiratory motion artifact with mild dependent atelectasis in the lower lobes. Musculoskeletal: No chest wall mass or suspicious bone lesions identified. Thoracic spondylosis. Slight depression of the T4 and T7 superior endplates with subjacent osseous sclerosis. CT ABDOMEN PELVIS FINDINGS Hepatobiliary: Mildly decreased attenuation of the liver diffusely suggestive of steatosis, with likely small areas of more focal fatty deposition at the gallbladder fossa. Mildly distended gallbladder without evidence of wall thickening, pericholecystic inflammatory change, or biliary dilatation. Pancreas: 1.9 x 1.2 cm low-density/cystic lesion in the pancreatic head. No pancreatic ductal dilatation or atrophy. No peripancreatic inflammatory change. Spleen: Unremarkable. Adrenals/Urinary Tract: Adrenal glands are unremarkable. 1.4 cm right upper pole renal cyst. No hydronephrosis or renal calculi. Unremarkable bladder. Stomach/Bowel: Mild gaseous distention of the stomach without evidence of wall thickening. Oral contrast is present in multiple nondilated  loops of small bowel. There is no evidence of bowel obstruction or inflammatory process. A moderate amount of stool is present throughout the colon. The appendix is unremarkable. Vascular/Lymphatic: Abdominal aortic atherosclerosis without aneurysm. No enlarged lymph nodes are identified. Reproductive: Status post hysterectomy. No adnexal masses. Other: No intraperitoneal free fluid.  No abdominal wall hernia. Musculoskeletal: Old posterior left twelfth rib fracture. Old displaced right L1 transverse process fracture. Deformity of the left L2 transverse process consistent with old fracture. Mildly angulated left L3 transverse process fracture and slightly distracted right L4 transverse process fracture, both of indeterminate age. Old left L4 transverse process fracture. Likely bilateral L5 transverse process fractures of indeterminate age. L3 superior endplate Schmorl's node deformity. Grade 1 anterolisthesis of L4 on L5 and L5 on S1, facet mediated. Bilateral L5-S1 facet ankylosis. Bilateral sacral insufficiency type fractures of indeterminate acuity though potentially recent/ unhealed. 1.5 cm densely sclerotic focus in the S3 segment, nonspecific but may represent a bone island. IMPRESSION: 1. No evidence of malignancy in the chest. 2. 1.9 cm cystic pancreatic head lesion, indeterminate. Consider follow-up contrast-enhanced abdominal MRI or pancreatic protocol CT in 2 years to assess stability. 3. Multiple transverse process fractures in the lumbar spine, many of which are chronic although some are of indeterminate age.  4. Bilateral sacral insufficiency fractures. 5. Mild T4 and T7 superior endplate fracture deformities of indeterminate age. 6. Small pericardial effusion. 7. Hepatic steatosis. 8. Aortic atherosclerosis. Electronically Signed: By: Sebastian Ache M.D. On: 05/16/2016 14:40   Dg Hips Bilat W Or Wo Pelvis 3-4 Views  Result Date: 05/16/2016 CLINICAL DATA:  Bilateral hip, lower lumbosacral and sacral  pain for 1 week. Progressive pain. No known injury. EXAM: DG HIP (WITH OR WITHOUT PELVIS) 3-4V BILAT COMPARISON:  No prior hyper pelvis radiograph. FINDINGS: The cortical margins of the bony pelvis and both hips are intact. No fracture. Pubic symphysis and sacroiliac joints are congruent. Both femoral heads are well-seated in the respective acetabula. Scattered sclerotic densities over the sacrum both iliac bones are nonspecific, favored benign bone islands. IMPRESSION: No acute bony abnormality to explain bilateral hip pain. Electronically Signed   By: Rubye Oaks M.D.   On: 05/16/2016 02:37        Scheduled Meds: . aspirin EC  81 mg Oral Daily  . atorvastatin  40 mg Oral QHS  . cefTRIAXone (ROCEPHIN)  IV  1 g Intravenous Q24H  . cyanocobalamin  1,000 mcg Intramuscular Daily  . enoxaparin (LOVENOX) injection  40 mg Subcutaneous Q24H  . feeding supplement (ENSURE ENLIVE)  237 mL Oral BID BM  . insulin aspart  0-5 Units Subcutaneous QHS  . insulin aspart  0-9 Units Subcutaneous TID WC  . potassium chloride  20 mEq Oral BID  . sodium chloride flush  3 mL Intravenous Q12H   Continuous Infusions:    LOS: 1 day    Time spent: 38   Dron Jaynie Collins, MD [facility] Pager [phone]  If 7PM-7AM, please contact night-coverage [email or web address] Password [facility] 05/17/2016, 4:28 PM

## 2016-05-18 DIAGNOSIS — G9341 Metabolic encephalopathy: Secondary | ICD-10-CM

## 2016-05-18 LAB — BASIC METABOLIC PANEL
Anion gap: 12 (ref 5–15)
BUN: 14 mg/dL (ref 6–20)
CHLORIDE: 92 mmol/L — AB (ref 101–111)
CO2: 25 mmol/L (ref 22–32)
CREATININE: 0.69 mg/dL (ref 0.44–1.00)
Calcium: 8.8 mg/dL — ABNORMAL LOW (ref 8.9–10.3)
GFR calc non Af Amer: >60 mL/min (ref >60)
Glucose, Bld: 253 mg/dL — ABNORMAL HIGH (ref 65–99)
POTASSIUM: 4.6 mmol/L (ref 3.5–5.1)
Sodium: 129 mmol/L — ABNORMAL LOW (ref 135–145)

## 2016-05-18 LAB — NA AND K (SODIUM & POTASSIUM), RAND UR
POTASSIUM UR: 25 mmol/L
SODIUM UR: 87 mmol/L

## 2016-05-18 LAB — GLUCOSE, CAPILLARY
GLUCOSE-CAPILLARY: 217 mg/dL — AB (ref 65–99)
Glucose-Capillary: 185 mg/dL — ABNORMAL HIGH (ref 65–99)
Glucose-Capillary: 202 mg/dL — ABNORMAL HIGH (ref 65–99)
Glucose-Capillary: 168 mg/dL — ABNORMAL HIGH (ref 65–99)

## 2016-05-18 LAB — OSMOLALITY, URINE: OSMOLALITY UR: 395 mosm/kg (ref 300–900)

## 2016-05-18 LAB — MAGNESIUM: Magnesium: 1.5 mg/dL — ABNORMAL LOW (ref 1.7–2.4)

## 2016-05-18 MED ORDER — MAGNESIUM SULFATE 2 GM/50ML IV SOLN
2.0000 g | Freq: Once | INTRAVENOUS | Status: AC
Start: 2016-05-18 — End: 2016-05-18
  Administered 2016-05-18: 2 g via INTRAVENOUS
  Filled 2016-05-18: qty 50

## 2016-05-18 MED ORDER — ENSURE ENLIVE PO LIQD: 237.0000 mL | Freq: Two times a day (BID) | ORAL | Status: DC | PRN

## 2016-05-18 MED ORDER — LISINOPRIL 20 MG PO TABS
20.0000 mg | ORAL_TABLET | Freq: Every day | ORAL | Status: DC
  Administered 2016-05-18 – 2016-05-22 (×5): 20 mg via ORAL
  Filled 2016-05-18 (×5): qty 1

## 2016-05-18 MED ORDER — GLUCERNA SHAKE PO LIQD
237.0000 mL | Freq: Two times a day (BID) | ORAL | Status: DC
  Administered 2016-05-18 – 2016-05-22 (×9): 237 mL via ORAL

## 2016-05-18 NOTE — Progress Notes (Addendum)
PROGRESS NOTE    Ellen Payne  ZOX:[id number] DOB: 11/02/1935 DOA: 05/16/2016 PCP: Cala BradfordWHITE,CYNTHIA S, MD  Brief Narrative: 80 year old female with history of type 2 diabetes, hypertension who presented with back pain, lower leg weakness and fever. In the ER patient with tachycardia, hyponatremia, leukocytosis, elevated lactate level and positive UTI. Patient is being treated for sepsis due to UTI. She has abnormal MRI finding with a degenerative changes in her lower back screening pain and lower extremity weakness.   Assessment & Plan:   # Presumed Sepsis due to UTI (? Acute cystitis without hematuria): Continue ceftriaxone IV -  Pending urine and blood culture result - CT a/p and chest result reviewed.  -I will continue broad spectrum IV antibiotics until I have culture result. Seems like she is responding with current management.   # Possible acute metabolic encephalopathy in the setting of infection: Patient has waxing and waning mental status.  As per Rn, pt was confused this morning (alert, awake but Ox1). I examined the patient at bedside. She was pleasant, finished eating breakfast. She was alert, awake, [facility] and name. No focal neurological exam therefore hold off further imaging studies. Continue to monitor mental status.  -TSH acceptable -on b12 injection   # Lower back pain and b/l leg weakness. MRI lumbar result reviewed, consistent with b/l sacral insufficiency fractures, moderate canal stenosis and chronic degenerative changes.  -Pt, OT evaluation ongoing.  -Social worker was referred for possible SNF/rehab discharge  -pt likely benefits from rehab on discharge.   # Hyponatremia, hypokalemia : likely dehydration. -urine Na and osmolality elevated. Discontinue NS IVF. May need free water restriction if no improvement.  -monitor labs.  # Hypomagnesemia: replete MgSo4.  # Type 2 diabetes mellitus without complication, without long-term  current use of insulin (HCC):Continue current insulin regimen. Monitor blood sugar level.  -metformin on hold.   # HTN, essential: BP elevated. Will resume home dose of lisinopril. Monitor BP.    DVT prophylaxis: lovenox sq Code Status: Full Family Communication: None Disposition Plan: Likely discharge to rehab in 1-2 days.   Consultants:   None   Antimicrobials:ceftriaxone.  Subjective: Patient was seen and examined at bedside. Patient was finishing up her breakfast. Back pain is stable. No headache, dizziness, nausea, vomiting, abdomen pain, SOB, chest pain.  Objective: Vitals:   05/17/16 0425 05/17/16 1208 05/17/16 2012 05/18/16 0553  BP: 130/65 122/62 118/63 (!) 159/89  Pulse: 83 90 87 97  Resp: (!) 22 20 20 20   Temp: 98 F (36.7 C) 98.2 F (36.8 C) 98.2 F (36.8 C) 98.7 F (37.1 C)  TempSrc: Oral Oral Oral Oral  SpO2: 100% 98% 98% 97%  Weight: 58.8 kg (129 lb 11.2 oz)   58.7 kg (129 lb 6.4 oz)  Height:        Intake/Output Summary (Last 24 hours) at 05/18/16 0834 Last data filed at 05/18/16 0247  Gross per 24 hour  Intake             1719 ml  Output             1600 ml  Net              119 ml   Filed Weights   05/16/16 0638 05/17/16 0425 05/18/16 0553  Weight: 56.7 kg (125 lb) 58.8 kg (129 lb 11.2 oz) 58.7 kg (129 lb 6.4 oz)    Examination:  General exam: NAD, calm comfortable. Respiratory system: CTA b/l, no  wheeze or crackle. Cardiovascular system: RRR, s1s2 nl, no murmur Gastrointestinal system: Bs+, soft, nontender, non-distended Central nervous system: alert, awake, oriented to [place], September 2017 and name.  Extremities: 5/5 in all extremities. Skin: No rashes, lesions or ulcers    Data Reviewed: I have personally reviewed following labs and imaging studies  CBC:  Recent Labs Lab 05/16/16 0201 05/16/16 1603 05/17/16 0030  WBC 12.2*  --  10.6*  NEUTROABS 10.4*  --   --   HGB 11.1*  --  10.3*  HCT 33.3* 30.0* 31.5*  MCV  92.0  --  93.2  PLT 655*  --  574*   Basic Metabolic Panel:  Recent Labs Lab 05/16/16 0201 05/16/16 0319 05/16/16 1129 05/17/16 0030 05/18/16 0521  NA 126*  --  131* 129* 129*  K 2.8*  --  3.0* 3.9 4.6  CL 84*  --  88* 91* 92*  CO2 28  --  31 29 25   GLUCOSE 183*  --  124* 123* 253*  BUN 23*  --  15 16 14   CREATININE 0.77  --  0.73 0.77 0.69  CALCIUM 8.7*  --  9.0 8.8* 8.8*  MG  --  1.3*  --   --  1.5*   GFR: Estimated Creatinine Clearance (by C-G formula based on SCr of 0.69 mg/dL) Female: 16.1 mL/min Female: 63.2 mL/min Liver Function Tests:  Recent Labs Lab 05/16/16 0201  AST 36  ALT 17  ALKPHOS 154*  BILITOT 0.8  PROT 5.9*  ALBUMIN 3.1*   No results for input(s): LIPASE, AMYLASE in the last 168 hours. No results for input(s): AMMONIA in the last 168 hours. Coagulation Profile: No results for input(s): INR, PROTIME in the last 168 hours. Cardiac Enzymes: No results for input(s): CKTOTAL, CKMB, CKMBINDEX, TROPONINI in the last 168 hours. BNP (last 3 results) No results for input(s): PROBNP in the last 8760 hours. HbA1C: No results for input(s): HGBA1C in the last 72 hours. CBG:  Recent Labs Lab 05/17/16 0540 05/17/16 1126 05/17/16 1643 05/17/16 2110 05/18/16 0728  GLUCAP 159* 272* 191* 178* 217*   Lipid Profile: No results for input(s): CHOL, HDL, LDLCALC, TRIG, CHOLHDL, LDLDIRECT in the last 72 hours. Thyroid Function Tests:  Recent Labs  05/16/16 0201  TSH 1.396   Anemia Panel:  Recent Labs  05/16/16 1129  VITAMINB12 66*  FERRITIN 93   Sepsis Labs:  Recent Labs Lab 05/16/16 0330 05/16/16 1129 05/16/16 2009 05/17/16 0030  LATICACIDVEN 2.11* 2.8* 2.2* 0.9    Recent Results (from the past 240 hour(s))  Culture, Urine     Status: Abnormal   Collection Time: 05/16/16  2:08 AM  Result Value Ref Range Status   Specimen Description URINE, RANDOM  Final   Special Requests NONE  Final   Culture MULTIPLE SPECIES PRESENT, SUGGEST  RECOLLECTION (A)  Final   Report Status 05/17/2016 FINAL  Final  Blood culture (routine x 2)     Status: None (Preliminary result)   Collection Time: 05/16/16  3:24 AM  Result Value Ref Range Status   Specimen Description BLOOD LEFT ARM  Final   Special Requests IN PEDIATRIC BOTTLE  Final   Culture NO GROWTH 1 DAY  Final   Report Status PENDING  Incomplete  Blood culture (routine x 2)     Status: None (Preliminary result)   Collection Time: 05/16/16  3:25 AM  Result Value Ref Range Status   Specimen Description BLOOD RIGHT FOREARM  Final   Special  Requests BOTTLES DRAWN AEROBIC AND ANAEROBIC  Final   Culture NO GROWTH 1 DAY  Final   Report Status PENDING  Incomplete         Radiology Studies: Ct Chest W Contrast  Addendum Date: 05/16/2016   ADDENDUM REPORT: 05/16/2016 19:41 ADDENDUM: Mildly ectatic ascending thoracic aorta. Recommend annual imaging followup by CTA or MRA. This recommendation follows 2010 ACCF/AHA/AATS/ACR/ASA/SCA/SCAI/SIR/STS/SVM Guidelines for the Diagnosis and Management of Patients with Thoracic Aortic Disease. Circulation. 2010; 121: Z610-R604 Electronically Signed   By: Sebastian Ache M.D.   On: 05/16/2016 19:41   Result Date: 05/16/2016 CLINICAL DATA:  80 pound weight loss over 1 year. Bilateral hip pain. EXAM: CT CHEST, ABDOMEN, AND PELVIS WITH CONTRAST TECHNIQUE: Multidetector CT imaging of the chest, abdomen and pelvis was performed following the standard protocol during bolus administration of intravenous contrast. CONTRAST:  80mL ISOVUE-300 IOPAMIDOL (ISOVUE-300) INJECTION 61% COMPARISON:  None. FINDINGS: CT CHEST FINDINGS Cardiovascular: Mild thoracic aortic atherosclerosis. Ascending thoracic aorta measures up to 4.0 cm in diameter. Three-vessel coronary artery atherosclerosis. Heart size is within normal limits. There is a small pericardial effusion. Mediastinum/Nodes: Diffusely heterogeneous thyroid with small nodules measuring up to 1.3 cm in size. No  enlarged axillary, mediastinal, or hilar lymph nodes are identified. Lungs/Pleura: No pleural effusion. Major airways are patent. Partially calcified right upper lobe nodule measures 6 mm (series 205, image 30). A 2 mm calcified nodule is noted in the left lower lobe (series 25, image 76). There is mild respiratory motion artifact with mild dependent atelectasis in the lower lobes. Musculoskeletal: No chest wall mass or suspicious bone lesions identified. Thoracic spondylosis. Slight depression of the T4 and T7 superior endplates with subjacent osseous sclerosis. CT ABDOMEN PELVIS FINDINGS Hepatobiliary: Mildly decreased attenuation of the liver diffusely suggestive of steatosis, with likely small areas of more focal fatty deposition at the gallbladder fossa. Mildly distended gallbladder without evidence of wall thickening, pericholecystic inflammatory change, or biliary dilatation. Pancreas: 1.9 x 1.2 cm low-density/cystic lesion in the pancreatic head. No pancreatic ductal dilatation or atrophy. No peripancreatic inflammatory change. Spleen: Unremarkable. Adrenals/Urinary Tract: Adrenal glands are unremarkable. 1.4 cm right upper pole renal cyst. No hydronephrosis or renal calculi. Unremarkable bladder. Stomach/Bowel: Mild gaseous distention of the stomach without evidence of wall thickening. Oral contrast is present in multiple nondilated loops of small bowel. There is no evidence of bowel obstruction or inflammatory process. A moderate amount of stool is present throughout the colon. The appendix is unremarkable. Vascular/Lymphatic: Abdominal aortic atherosclerosis without aneurysm. No enlarged lymph nodes are identified. Reproductive: Status post hysterectomy. No adnexal masses. Other: No intraperitoneal free fluid.  No abdominal wall hernia. Musculoskeletal: Old posterior left twelfth rib fracture. Old displaced right L1 transverse process fracture. Deformity of the left L2 transverse process consistent with  old fracture. Mildly angulated left L3 transverse process fracture and slightly distracted right L4 transverse process fracture, both of indeterminate age. Old left L4 transverse process fracture. Likely bilateral L5 transverse process fractures of indeterminate age. L3 superior endplate Schmorl's node deformity. Grade 1 anterolisthesis of L4 on L5 and L5 on S1, facet mediated. Bilateral L5-S1 facet ankylosis. Bilateral sacral insufficiency type fractures of indeterminate acuity though potentially recent/ unhealed. 1.5 cm densely sclerotic focus in the S3 segment, nonspecific but may represent a bone island. IMPRESSION: 1. No evidence of malignancy in the chest. 2. 1.9 cm cystic pancreatic head lesion, indeterminate. Consider follow-up contrast-enhanced abdominal MRI or pancreatic protocol CT in 2 years to assess stability. 3. Multiple transverse  process fractures in the lumbar spine, many of which are chronic although some are of indeterminate age. 4. Bilateral sacral insufficiency fractures. 5. Mild T4 and T7 superior endplate fracture deformities of indeterminate age. 6. Small pericardial effusion. 7. Hepatic steatosis. 8. Aortic atherosclerosis. Electronically Signed: By: Allen  Grady M.D. On: 05/16/2016 14:40   Mr Lumbar Spine Wo Contrast  Result Date: 05/16/2016 CLINICAL DATA:  Initial evaluation for acute back pain, inability to walk. EXAM: MRI LUMBAR SPINE WITHOUT CONTRAST TECHNIQUE: Multiplanar, multisequence MR imaging of the lumbar spine was performed. No intravenous contrast was administered. COMPARISON:  Prior CT from earlier the same day. FINDINGS: Segmentation: Normal segmentation. Lowest well-formed disc is labeled the L5-S1 level. Alignment: 5 mm anterolisthesis of L5 on S1. Trace 2 mm anterolisthesis of L4 on L5. Exaggeration of the normal lumbar lordosis. Vertebrae: Abnormal T1 hypo intense, T2/STIR hyperintense signal intensity present within the bilateral sacral ala, compatible with acute  insufficiency fractures. Chronic height loss at the superior endplate of L3. Vertebral body heights otherwise maintained. No acute vertebral body fracture. Signal intensity within the vertebral body bone marrow otherwise normal. No worrisome osseous lesions. Conus medullaris: Extends to the L2 level and appears normal. Paraspinal and other soft tissues: Paraspinous soft tissues demonstrate no acute abnormality. Fatty atrophy noted within the paraspinous musculature. Few scattered T2 hyperintense cyst noted within the kidneys. Disc levels: T12-L1:Diffuse degenerative disc bulge with disc desiccation and intervertebral disc space narrowing. No significant stenosis. L1-2: Diffuse disc bulge with disc desiccation. No focal disc herniation. No significant stenosis. L2-3: Diffuse degenerative disc bulge with disc desiccation. No focal disc protrusion. Mild facet and ligamentum flavum hypertrophy. Mild canal and bilateral foraminal narrowing. L3-4: Mild diffuse disc bulge with disc desiccation. No focal disc herniation. Mild bilateral facet arthrosis with ligamentum flavum hypertrophy. Resultant mild lateral recess stenosis bilaterally. Mild foraminal narrowing, left slightly worse than right related disc bulge and facet disease. L4-5: Trace 2 mm anterolisthesis of L4 on L5. Associated mild broad-based posterior disc bulge. Severe bilateral facet arthrosis with ligamentum flavum hypertrophy. Resultant moderate canal stenosis. Mild bilateral foraminal narrowing related disc bulge and facet disease. L5-S1: 5 mm anterolisthesis of L5 on S1. Associated mild disc bulge with moderate bilateral facet arthrosis. No significant canal or foraminal stenosis. IMPRESSION: 1. Acute bilateral sacral insufficiency fractures. 2. No other acute abnormality within the lumbar spine. No evidence for cord compression. 3. 2 mm anterolisthesis of L4 on L5 with associated severe bilateral facet arthropathy, resulting in moderate canal stenosis. 4.  Additional more mild multilevel degenerative spondylolysis as above. No other significant canal or foraminal narrowing within the lumbar spine. Electronically Signed   By: Benjamin  McClintock M.D.   On: 05/16/2016 19:51   Ct Abdomen Pelvis W Contrast  Addendum Date: 05/16/2016   ADDENDUM REPORT: 05/16/2016 19:41 ADDENDUM: Mildly ectatic ascending thoracic aorta. Recommend annual imaging followup by CTA or MRA. This recommendation follows 2010 ACCF/AHA/AATS/ACR/ASA/SCA/SCAI/SIR/STS/SVM Guidelines for the Diagnosis and Management of Patients with Thoracic Aortic Disease. Circulation. 2010; 121: [id number] Electronically Signed   By: Allen  Grady M.D.   On: 05/16/2016 19:41   Result Date: 05/16/2016 CLINICAL DATA:  80 pound weight loss over 1 year. Bilateral hip pain. EXAM: CT CHEST, ABDOMEN, AND PELVIS WITH CONTRAST TECHNIQUE: Multidetector CT imaging of the chest, abdomen and pelvis was performed following the standard protocol during bolus administration of intravenous contrast. 55mONDayton MartesOVUE-300) INJECTION 61% COMPARISON:  None. FINDINGS: CT CHEST FINDINGS Cardiovascular: Mild thoracic aortic atherosclerosis. Ascending thoracic  aorta measures up to 4.0 cm in diameter. Three-vessel coronary artery atherosclerosis. Heart size is within normal limits. There is a small pericardial effusion. Mediastinum/Nodes: Diffusely heterogeneous thyroid with small nodules measuring up to 1.3 cm in size. No enlarged axillary, mediastinal, or hilar lymph nodes are identified. Lungs/Pleura: No pleural effusion. Major airways are patent. Partially calcified right upper lobe nodule measures 6 mm (series 205, image 30). A 2 mm calcified nodule is noted in the left lower lobe (series 25, image 76). There is mild respiratory motion artifact with mild dependent atelectasis in the lower lobes. Musculoskeletal: No chest wall mass or suspicious bone lesions identified. Thoracic spondylosis. Slight  depression of the T4 and T7 superior endplates with subjacent osseous sclerosis. CT ABDOMEN PELVIS FINDINGS Hepatobiliary: Mildly decreased attenuation of the liver diffusely suggestive of steatosis, with likely small areas of more focal fatty deposition at the gallbladder fossa. Mildly distended gallbladder without evidence of wall thickening, pericholecystic inflammatory change, or biliary dilatation. Pancreas: 1.9 x 1.2 cm low-density/cystic lesion in the pancreatic head. No pancreatic ductal dilatation or atrophy. No peripancreatic inflammatory change. Spleen: Unremarkable. Adrenals/Urinary Tract: Adrenal glands are unremarkable. 1.4 cm right upper pole renal cyst. No hydronephrosis or renal calculi. Unremarkable bladder. Stomach/Bowel: Mild gaseous distention of the stomach without evidence of wall thickening. Oral contrast is present in multiple nondilated loops of small bowel. There is no evidence of bowel obstruction or inflammatory process. A moderate amount of stool is present throughout the colon. The appendix is unremarkable. Vascular/Lymphatic: Abdominal aortic atherosclerosis without aneurysm. No enlarged lymph nodes are identified. Reproductive: Status post hysterectomy. No adnexal masses. Other: No intraperitoneal free fluid.  No abdominal wall hernia. Musculoskeletal: Old posterior left twelfth rib fracture. Old displaced right L1 transverse process fracture. Deformity of the left L2 transverse process consistent with old fracture. Mildly angulated left L3 transverse process fracture and slightly distracted right L4 transverse process fracture, both of indeterminate age. Old left L4 transverse process fracture. Likely bilateral L5 transverse process fractures of indeterminate age. L3 superior endplate Schmorl's node deformity. Grade 1 anterolisthesis of L4 on L5 and L5 on S1, facet mediated. Bilateral L5-S1 facet ankylosis. Bilateral sacral insufficiency type fractures of indeterminate acuity though  potentially recent/ unhealed. 1.5 cm densely sclerotic focus in the S3 segment, nonspecific but may represent a bone island. IMPRESSION: 1. No evidence of malignancy in the chest. 2. 1.9 cm cystic pancreatic head lesion, indeterminate. Consider follow-up contrast-enhanced abdominal MRI or pancreatic protocol CT in 2 years to assess stability. 3. Multiple transverse process fractures in the lumbar spine, many of which are chronic although some are of indeterminate age. 4. Bilateral sacral insufficiency fractures. 5. Mild T4 and T7 superior endplate fracture deformities of indeterminate age. 6. Small pericardial effusion. 7. Hepatic steatosis. 8. Aortic atherosclerosis. Electronically Signed: By: Sebastian Ache M.D. On: 05/16/2016 14:40        Scheduled Meds: . aspirin EC  81 mg Oral Daily  . atorvastatin  40 mg Oral QHS  . cefTRIAXone (ROCEPHIN)  IV  1 g Intravenous Q24H  . cyanocobalamin  1,000 mcg Intramuscular Daily  . enoxaparin (LOVENOX) injection  40 mg Subcutaneous Q24H  . feeding supplement (GLUCERNA SHAKE)  237 mL Oral BID BM  . insulin aspart  0-5 Units Subcutaneous QHS  . insulin aspart  0-9 Units Subcutaneous TID WC  . magnesium sulfate 1 - 4 g bolus IVPB  2 g Intravenous Once  . potassium chloride  20 mEq Oral BID  . sodium chloride flush  3 mL Intravenous Q12H   Continuous Infusions: . sodium chloride 75 mL/hr at 05/18/16 0018     LOS: 2 days    Time spent: 25   Claiborne Stroble Jaynie Collins, MD [facility] Pager [phone]  If 7PM-7AM, please contact night-coverage [email or web address] Password [facility] 05/18/2016, 8:34 AM

## 2016-05-18 NOTE — Progress Notes (Signed)
Pt mental status changed from A&O x4 yesterday (05/17/16) to A&O x1 today (05/18/16).  MD has been notified.

## 2016-05-18 NOTE — Clinical Social Work Note (Signed)
Clinical Social Work Assessment  Patient Details  Name: Ellen Payne MRN: [id number] Date of Birth: 1936-02-10  Date of referral:  05/18/16               Reason for consult:  Facility Placement, Discharge Planning                Permission sought to share information with:  Facility Sport and exercise psychologist, Family Supports Permission granted to share information::  Yes, Verbal Permission Granted  Name::     Ellen Payne  Agency::  SNF's  Relationship::  Daughter  Contact Information:  [phone]  Housing/Transportation Living arrangements for the past 2 months:  Single Family Home Source of Information:  Patient, Medical Team, Adult Children Patient Interpreter Needed:  None Criminal Activity/Legal Involvement Pertinent to Current Situation/Hospitalization:  No - Comment as needed Significant Relationships:  Adult Children, Spouse, Other Family Members Lives with:  Spouse Do you feel safe going back to the place where you live?  Yes Need for family participation in patient care:  Yes (Comment)  Care giving concerns:  PT recommending SNF once patient medically stable for discharge.   Social Worker assessment / plan:  CSW met with patient. Daughter at bedside. CSW introduced role and explained that PT recommendations would be discussed. Patient and her daughter agreeable to SNF placement and first preference is [place]. Patient will likely need PTAR. No further concerns. CSW encouraged patient and her daughter to contact CSW as needed. CSW will continue to follow patient for support and facilitate discharge to SNF once medically stable.  Employment status:  Retired Nurse, adult PT Recommendations:  [place] / Referral to community resources:  [place]  Patient/Family's Response to care:  Patient and her daughter agreeable to SNF placement. Patient's daughter supportive and involved in patient's  care. Patient and her daughter appreciated social work intervention.  Patient/Family's Understanding of and Emotional Response to Diagnosis, Current Treatment, and Prognosis:  Patient and her daughter understand need for rehab prior to returning home. Patient and her daughter appear happy with hospital care.  Emotional Assessment Appearance:  Appears stated age Attitude/Demeanor/Rapport:  Other (Pleasant) Affect (typically observed):  Accepting, Appropriate, Calm, Pleasant Orientation:  Oriented to Self, Oriented to Place, Oriented to Situation, Oriented to  Time Alcohol / Substance use:  Never Used Psych involvement (Current and /or in the community):  No (Comment)  Discharge Needs  Concerns to be addressed:  Care Coordination Readmission within the last 30 days:  No Current discharge risk:  Dependent with Mobility Barriers to Discharge:  No Barriers Identified   Candie Chroman, LCSW 05/18/2016, 3:10 PM

## 2016-05-18 NOTE — [place] FL2 (Signed)
[place] MEDICAID FL2 LEVEL OF CARE SCREENING TOOL     IDENTIFICATION  Patient Name: Ellen Payne Birthdate: 12/09/1935 Sex: female Admission Date (Current Location): 05/16/2016  [facility] [place]   Facility and Address:  [facility] [place], [address], [place], [place] [address]      Provider Number: [id number]  Attending Physician Name and Address:  Maxie Barbron Prasad Bhandari, MD  Relative Name and Phone Number:       Current Level of Care: Hospital Recommended Level of Care: Skilled Nursing Facility Prior Approval Number:    Date Approved/Denied:   PASRR Number: [id number]  Discharge Plan: SNF    Current Diagnoses: Patient Active Problem List   Diagnosis Date Noted  . Encephalopathy, metabolic   . Lower back pain   . Prolonged QT interval 05/16/2016  . Hypomagnesemia 05/16/2016  . Hypokalemia 05/16/2016  . Hyponatremia 05/16/2016  . Acute hyponatremia 05/16/2016  . Sepsis (HCC) 05/16/2016  . UTI (lower urinary tract infection) 05/16/2016  . Anemia, unspecified 05/16/2016  . Type 2 diabetes mellitus without complication, without long-term current use of insulin (HCC) 05/16/2016  . Essential hypertension 05/16/2016    Orientation RESPIRATION BLADDER Height & Weight     Self, Time, Situation, Place  Normal Continent Weight: 129 lb 6.4 oz (58.7 kg) (scale b) Height:  5\' 6"  (167.6 cm)  BEHAVIORAL SYMPTOMS/MOOD NEUROLOGICAL BOWEL NUTRITION STATUS   (None)  (None) Continent Diet (Carb modified)  AMBULATORY STATUS COMMUNICATION OF NEEDS Skin   Limited Assist Verbally Bruising                       Personal Care Assistance Level of Assistance  Bathing, Feeding, Dressing Bathing Assistance: Limited assistance Feeding assistance: Independent Dressing Assistance: Limited assistance     Functional Limitations Info  Sight, Hearing, Speech Sight Info: Adequate Hearing Info: Adequate Speech Info: Adequate     SPECIAL CARE FACTORS FREQUENCY  Blood pressure, PT (By licensed PT), OT (By licensed OT)     PT Frequency: 5 x week OT Frequency: 5 x week            Contractures Contractures Info: Not present    Additional Factors Info  Code Status, Allergies Code Status Info: Full Allergies Info: NKDA           Current Medications (05/18/2016):  This is the current hospital active medication list Current Facility-Administered Medications  Medication Dose Route Frequency Provider Last Rate Last Dose  . acetaminophen (TYLENOL) tablet 650 mg  650 mg Oral Q6H PRN Alberteen Samhristopher P Danford, MD   650 mg at 05/18/16 0019   Or  . acetaminophen (TYLENOL) suppository 650 mg  650 mg Rectal Q6H PRN Alberteen Samhristopher P Danford, MD      . aspirin EC tablet 81 mg  81 mg Oral Daily Alberteen Samhristopher P Danford, MD   81 mg at 05/18/16 1116  . atorvastatin (LIPITOR) tablet 40 mg  40 mg Oral QHS Alberteen Samhristopher P Danford, MD   40 mg at 05/17/16 2230  . cefTRIAXone (ROCEPHIN) 1 g in dextrose 5 % 50 mL IVPB  1 g Intravenous Q24H Veronda P Bryk, RPH   1 g at 05/18/16 0000  . cyanocobalamin ((VITAMIN B-12)) injection 1,000 mcg  1,000 mcg Intramuscular Daily Osvaldo ShipperGokul Krishnan, MD   1,000 mcg at 05/18/16 1116  . enoxaparin (LOVENOX) injection 40 mg  40 mg Subcutaneous Q24H Dron Jaynie CollinsPrasad Bhandari, MD   40 mg at 05/17/16 1714  .  feeding supplement (ENSURE ENLIVE) (ENSURE ENLIVE) liquid 237 mL  237 mL Oral BID PRN Dron Jaynie Collins, MD      . feeding supplement (GLUCERNA SHAKE) (GLUCERNA SHAKE) liquid 237 mL  237 mL Oral BID BM Dron Jaynie Collins, MD   237 mL at 05/18/16 1111  . insulin aspart (novoLOG) injection 0-5 Units  0-5 Units Subcutaneous QHS Alberteen Sam, MD      . insulin aspart (novoLOG) injection 0-9 Units  0-9 Units Subcutaneous TID WC Alberteen Sam, MD   3 Units at 05/18/16 1115  . lisinopril (PRINIVIL,ZESTRIL) tablet 20 mg  20 mg Oral Daily Dron Jaynie Collins, MD   20 mg at 05/18/16 1116  . potassium  chloride SA (K-DUR,KLOR-CON) CR tablet 20 mEq  20 mEq Oral BID Alberteen Sam, MD   20 mEq at 05/18/16 1117  . sodium chloride flush (NS) 0.9 % injection 3 mL  3 mL Intravenous Q12H Alberteen Sam, MD   3 mL at 05/18/16 1117     Discharge Medications: Please see discharge summary for a list of discharge medications.  Relevant Imaging Results:  Relevant Lab Results:   Additional Information SS#: [id number]  Margarito Liner, LCSW

## 2016-05-18 NOTE — Clinical Social Work Placement (Signed)
   CLINICAL SOCIAL WORK PLACEMENT  NOTE  Date:  05/18/2016  Patient Details  Name: Ellen FaceBarbara J Sequeira MRN: [id number] Date of Birth: 07/24/1936  Clinical Social Work is seeking post-discharge placement for this patient at the Skilled  Nursing Facility level of care (*CSW will initial, date and re-position this form in  chart as items are completed):  Yes   Patient/family provided with [facility] Clinical Social Work Department's list of facilities offering this level of care within the geographic area requested by the patient (or if unable, by the patient's family).  Yes   Patient/family informed of their freedom to choose among providers that offer the needed level of care, that participate in Medicare, Medicaid or managed care program needed by the patient, have an available bed and are willing to accept the patient.  Yes   Patient/family informed of [facility]'s ownership interest in [facility] and [facility], as well as of the fact that they are under no obligation to receive care at these facilities.  PASRR submitted to EDS on 05/18/16     PASRR number received on 05/18/16     Existing PASRR number confirmed on       FL2 transmitted to all facilities in geographic area requested by pt/family on 05/18/16     FL2 transmitted to all facilities within larger geographic area on       Patient informed that his/her managed care company has contracts with or will negotiate with certain facilities, including the following:            Patient/family informed of bed offers received.  Patient chooses bed at       Physician recommends and patient chooses bed at      Patient to be transferred to   on  .  Patient to be transferred to facility by       Patient family notified on   of transfer.  Name of family member notified:        PHYSICIAN Please sign FL2     Additional Comment:    _______________________________________________ Margarito LinerSarah C Taiyo Kozma, LCSW 05/18/2016,  3:12 PM

## 2016-05-18 NOTE — Progress Notes (Signed)
BP 139/101; HR 99.  MD notified.

## 2016-05-19 DIAGNOSIS — E440 Moderate protein-calorie malnutrition: Secondary | ICD-10-CM | POA: Insufficient documentation

## 2016-05-19 LAB — BASIC METABOLIC PANEL
Anion gap: 10 (ref 5–15)
BUN: 10 mg/dL (ref 6–20)
CO2: 26 mmol/L (ref 22–32)
CREATININE: 0.63 mg/dL (ref 0.44–1.00)
Calcium: 9 mg/dL (ref 8.9–10.3)
Chloride: 92 mmol/L — ABNORMAL LOW (ref 101–111)
GFR calc Af Amer: >60 mL/min (ref >60)
GLUCOSE: 187 mg/dL — AB (ref 65–99)
POTASSIUM: 4.6 mmol/L (ref 3.5–5.1)
SODIUM: 128 mmol/L — AB (ref 135–145)

## 2016-05-19 LAB — GLUCOSE, CAPILLARY
GLUCOSE-CAPILLARY: 171 mg/dL — AB (ref 65–99)
GLUCOSE-CAPILLARY: 229 mg/dL — AB (ref 65–99)
Glucose-Capillary: 186 mg/dL — ABNORMAL HIGH (ref 65–99)

## 2016-05-19 LAB — CBC
HEMATOCRIT: 35.9 % — AB (ref 36.0–46.0)
Hemoglobin: 11.7 g/dL — ABNORMAL LOW (ref 12.0–15.0)
MCH: 30.8 pg (ref 26.0–34.0)
MCHC: 32.6 g/dL (ref 30.0–36.0)
MCV: 94.5 fL (ref 78.0–100.0)
PLATELETS: 538 10*3/uL — AB (ref 150–400)
RBC: 3.8 MIL/uL — ABNORMAL LOW (ref 3.87–5.11)
RDW: 14.1 % (ref 11.5–15.5)
WBC: 8.9 10*3/uL (ref 4.0–10.5)

## 2016-05-19 MED ORDER — SODIUM CHLORIDE 1 G PO TABS
1.0000 g | ORAL_TABLET | Freq: Two times a day (BID) | ORAL | Status: DC
  Administered 2016-05-19: 1 g via ORAL
  Filled 2016-05-19 (×4): qty 1

## 2016-05-19 NOTE — Progress Notes (Signed)
Occupational Therapy Evaluation Patient Details Name: Ellen Payne MRN: [id number] DOB: October 10, 1935 Today's Date: 05/19/2016    History of Present Illness Ellen Payne is a 80 y.o. female with a past medical history significant for NIDDM, HTN who presents with back pain and leg weakness and fever.   Clinical Impression   Patient presents to OT with decreased ADL independence and safety due to the deficits listed below. She will benefit from skilled OT to maximize function and to facilitate a safe discharge. OT will follow.    Follow Up Recommendations  SNF    Equipment Recommendations  Other (comment) (tbd at SNF)    Recommendations for Other Services       Precautions / Restrictions Precautions Precautions: Fall Restrictions Weight Bearing Restrictions: No      Mobility Bed Mobility Overal bed mobility: Needs Assistance Bed Mobility: Supine to Sit;Sit to Supine     Supine to sit: Mod assist Sit to supine: Mod assist      Transfers Overall transfer level: Needs assistance Equipment used: 1 person hand held assist Transfers: Sit to/from [employer] Transfers Sit to Stand: Mod assist Stand pivot transfers: Mod assist       General transfer comment: pt with significant posterior lean in attempts to stand and pivot    Balance                                            ADL Overall ADL's : Needs assistance/impaired     Grooming: Wash/dry hands;Set up;Bed level               Lower Body Dressing: Total assistance;Bed level Lower Body Dressing Details (indicate cue type and reason): don socks and shoes Toilet Transfer: Moderate assistance;Squat-pivot;BSC   Toileting- Clothing Manipulation and Hygiene: Total assistance;Sitting/lateral lean       Functional mobility during ADLs: Moderate assistance General ADL Comments: Patient with significant posterior lean on attempts to stand and pivot to and from [facility]     Vision      Perception     Praxis      Pertinent Vitals/Pain Pain Assessment: Faces Faces Pain Scale: Hurts little more Pain Location: back, legs Pain Descriptors / Indicators: Discomfort;Grimacing Pain Intervention(s): Monitored during session     Hand Dominance Right   Extremity/Trunk Assessment Upper Extremity Assessment Upper Extremity Assessment: Generalized weakness   Lower Extremity Assessment Lower Extremity Assessment: Defer to PT evaluation   Cervical / Trunk Assessment Cervical / Trunk Assessment: Kyphotic   Communication Communication Communication: No difficulties   Cognition Arousal/Alertness: Awake/alert Behavior During Therapy: Anxious Overall Cognitive Status: Within Functional Limits for tasks assessed                     General Comments       Exercises       Shoulder Instructions      Home Living Family/patient expects to be discharged to:: Skilled nursing facility Living Arrangements: Spouse/significant other                               Additional Comments: pt was living with spouse who is legally blind. Pt was amb without AD until 2 weeks, now she requires RW, pt was indep with ADLs, had 1 story home with 5 steps to enter with Bilat  HR      Prior Functioning/Environment Level of Independence: Needs assistance  Gait / Transfers Assistance Needed: indep until 2 weeks ago, now needs RW and can only amb short distances ADL's / Homemaking Assistance Needed: was indep until 2 weeks ago        OT Diagnosis:   Acute hyponatremia  UTI (lower urinary tract infection)  Inability to walk  Hypokalemia  Hypomagnesemia  Weight loss  Lower back pain - Plan: MR Lumbar Spine Wo Contrast, MR Lumbar Spine Wo Contrast    OT Problem List: Decreased strength;Decreased activity tolerance;Impaired balance (sitting and/or standing);Decreased safety awareness;Decreased knowledge of use of DME or AE;Decreased knowledge of  precautions;Pain   OT Treatment/Interventions: Self-care/ADL training;DME and/or AE instruction;Therapeutic activities;Patient/family education    OT Goals(Current goals can be found in the care plan section) Acute Rehab OT Goals Patient Stated Goal: to get stronger and back to indep OT Goal Formulation: With patient Time For Goal Achievement: 06/02/16 Potential to Achieve Goals: Good ADL Goals Pt Will Perform Grooming: with min guard assist;standing Pt Will Perform Lower Body Dressing: with min assist;with adaptive equipment;sit to/from stand Pt Will Transfer to Toilet: with min guard assist;stand pivot transfer;bedside commode Pt Will Perform Toileting - Clothing Manipulation and hygiene: with min guard assist;sit to/from stand  OT Frequency: Min 2X/week   Barriers to D/C:            Co-evaluation              End of Session Nurse Communication: Mobility status  Activity Tolerance: Patient tolerated treatment well Patient left: in bed;with call bell/phone within reach;with family/visitor present   Time: [id number] OT Time Calculation (min): 13 min Charges:  OT General Charges $OT Visit: 1 Procedure OT Evaluation $OT Eval Moderate Complexity: 1 Procedure G-Codes:    Temprence Rhines A 05/19/2016, 1:08 PM

## 2016-05-19 NOTE — Progress Notes (Signed)
Physical Therapy Treatment Patient Details Name: Ellen Payne MRN: [id number] DOB: 12-10-1935 Today's Date: 05/19/2016    History of Present Illness KAELAH HAYASHI is a 80 y.o. female with a past medical history significant for NIDDM, HTN who presents with back pain and leg weakness and fever.    PT Comments    Pt admitted with above diagnosis. Pt currently with functional limitations due to balance and endurance deficits. Pt was unable to ambulate with RW  And barely able to stand and pivot to 3N1 and back.  Needed mod to max assist to transition.  SNF is the best plan for this pt.  Pt will benefit from skilled PT to increase their independence and safety with mobility to allow discharge to the venue listed below.    Follow Up Recommendations  SNF;Supervision/Assistance - 24 hour (pt prefers [facility])     Equipment Recommendations  None recommended by PT    Recommendations for Other Services       Precautions / Restrictions Precautions Precautions: Fall Restrictions Weight Bearing Restrictions: No    Mobility  Bed Mobility Overal bed mobility: Needs Assistance Bed Mobility: Supine to Sit;Sit to Supine     Supine to sit: Mod assist Sit to supine: Mod assist   General bed mobility comments: Needed assist for LEs and elevation of trunk.  Transfers Overall transfer level: Needs assistance Equipment used: Rolling walker (2 wheeled) Transfers: Sit to/from [employer] Sit to Stand: Mod assist Stand pivot transfers: Mod assist;Max assist       General transfer comment: pt with significant posterior lean in attempts to stand and pivot.  Pt needed extensive assist to 3N1 and back to bed needing as much as max assist getting back to bed as pt sitting too quickly.    Ambulation/Gait                 Stairs            Wheelchair Mobility    Modified Rankin (Stroke Patients Only)       Balance Overall balance  assessment: Needs assistance Sitting-balance support: Bilateral upper extremity supported;Feet supported Sitting balance-Leahy Scale: Poor Sitting balance - Comments: Needs assist to sit EOB   Standing balance support: Bilateral upper extremity supported;During functional activity Standing balance-Leahy Scale: Poor Standing balance comment: requires UE support for balance                     Cognition Arousal/Alertness: Awake/alert Behavior During Therapy: Anxious Overall Cognitive Status: Within Functional Limits for tasks assessed                      Exercises      General Comments        Pertinent Vitals/Pain Pain Assessment: No/denies pain  VSS    Home Living                      Prior Function            PT Goals (current goals can now be found in the care plan section) Acute Rehab PT Goals Patient Stated Goal: to get stronger and back to indep Progress towards PT goals: Not progressing toward goals - comment (mod assist to max assist for mobility)    Frequency    Min 3X/week      PT Plan Current plan remains appropriate    Co-evaluation  End of Session Equipment Utilized During Treatment: Gait belt Activity Tolerance: Patient limited by fatigue Patient left: in bed;with call bell/phone within reach;with bed alarm set;with family/visitor present     Time: [id number] PT Time Calculation (min) (ACUTE ONLY): 16 min  Charges:  $Therapeutic Activity: 8-22 mins                    G CodesBerline Lopes:      Alejos Reinhardt F 05/19/2016, 4:53 PM [employer] CorporationDawn Chenise Mulv[place]ill,PT Acute Rehabilitation [phone] (pager)

## 2016-05-19 NOTE — Progress Notes (Addendum)
PROGRESS NOTE    BARRI NEIDLINGER  ZOX:[id number] DOB: [birth year] DOA: 05/16/2016 PCP: Cala Bradford, MD  Brief Narrative: 80 year old female with history of type 2 diabetes, hypertension who presented with back pain, lower leg weakness and fever. In the ER patient with tachycardia, hyponatremia, leukocytosis, elevated lactate level and positive UTI. Patient is being treated for sepsis due to UTI. She has abnormal MRI finding with a degenerative changes in her lower back screening pain and lower extremity weakness.   Assessment & Plan:   # Presumed Sepsis due to UTI (? Acute cystitis without hematuria):  -  Still pending urine and blood culture result - CT a/p and chest result reviewed.  -I will continue IV ceftriaxone until I have culture results back.  # Possible acute metabolic encephalopathy in the setting of infection: Patient was alert awake and oriented to September 2017, Hospital and name today. She has no focal neurological deficit. I will continue current medical management. -TSH acceptable -on b12 injection   # Lower back pain and b/l leg weakness. MRI lumbar result reviewed, consistent with b/l sacral insufficiency fractures, moderate canal stenosis and chronic degenerative changes.  -Pt, OT evaluation ongoing.  -Social worker ongoing for safe discharge to SNF.  # Hyponatremia, hypokalemia : Hypokalemia improved. Serum sodium level did not improve with normal saline. Patient has urine osmolality of 395 with high urine sodium level. Possible SIADH in the setting of back pain. TSH level acceptable. Patient is euvolemic on physical exam. Her oral intake is good. I plan to do fluid restriction to 1500 mL in 24 hour and he started salt tablet twice a day. Monitor BMP.  -Patient was on hydrochlorothiazide at home however not receiving in the hospital. I'm planning to discontinue hydrochlorothiazide on discharge.  # Hypomagnesemia: Repeated magnesium yesterday. I would repeat  magnesium level tomorrow morning.  # Type 2 diabetes mellitus without complication, without long-term current use of insulin (HCC):Continue current insulin regimen. Monitor blood sugar level.  -metformin on hold.   # HTN, essential: Blood pressure acceptable. Continue lisinopril. Monitor blood pressure.  # Possible mild to moderate protein calorie malnutrition in the setting of chronic illness. Dietary referral.    DVT prophylaxis: lovenox sq Code Status: Full Family Communication: Discussed with the patient's daughter and patient's sister at bedside. Disposition Plan: Likely discharge to SNF in 1-2 days.   Consultants:   None   Antimicrobials:ceftriaxone.  Subjective: Patient was seen and examined at bedside. Patient denied headache, dizziness, nausea, vomiting, chest pain, shortness of breath. Able to tolerate diet well. Denied any urinary symptoms. Patient's daughter and sister at bedside.  Objective: Vitals:   05/18/16 1332 05/18/16 2106 05/19/16 0413 05/19/16 0917  BP: (!) 139/101 (!) 147/73 (!) 134/99 137/90  Pulse: 99 86 97 97  Resp: 18 18 18 16   Temp: 97.8 F (36.6 C) 97.9 F (36.6 C) 98.3 F (36.8 C) 97.9 F (36.6 C)  TempSrc: Oral Oral Oral Oral  SpO2: 97% 99% 100% 96%  Weight:   58.4 kg (128 lb 12.8 oz)   Height:        Intake/Output Summary (Last 24 hours) at 05/19/16 1327 Last data filed at 05/19/16 1305  Gross per 24 hour  Intake             1007 ml  Output             1701 ml  Net             -694 ml  Filed Weights   05/17/16 0425 05/18/16 0553 05/19/16 0413  Weight: 58.8 kg (129 lb 11.2 oz) 58.7 kg (129 lb 6.4 oz) 58.4 kg (128 lb 12.8 oz)    Examination:  General exam: Pleasant elderly female, not in distress. Respiratory system: Clear to auscultation bilateral, no wheezing or crackles. Cardiovascular system: Regular rate and rhythm, S1-S2 normal, no murmur appreciated. Gastrointestinal system: Soft, bowel sounds positive,  nontender. Central nervous system: Patient is alert, awake and oriented [facility] and name. No focal neurological deficit.  Extremities: 5/5 in all extremities. Skin: No rashes, lesions or ulcers    Data Reviewed: I have personally reviewed following labs and imaging studies  CBC:  Recent Labs Lab 05/16/16 0201 05/16/16 1603 05/17/16 0030 05/19/16 0500  WBC 12.2*  --  10.6* 8.9  NEUTROABS 10.4*  --   --   --   HGB 11.1*  --  10.3* 11.7*  HCT 33.3* 30.0* 31.5* 35.9*  MCV 92.0  --  93.2 94.5  PLT 655*  --  574* 538*   Basic Metabolic Panel:  Recent Labs Lab 05/16/16 0201 05/16/16 0319 05/16/16 1129 05/17/16 0030 05/18/16 0521 05/19/16 0500  NA 126*  --  131* 129* 129* 128*  K 2.8*  --  3.0* 3.9 4.6 4.6  CL 84*  --  88* 91* 92* 92*  CO2 28  --  31 29 25 26   GLUCOSE 183*  --  124* 123* 253* 187*  BUN 23*  --  15 16 14 10   CREATININE 0.77  --  0.73 0.77 0.69 0.63  CALCIUM 8.7*  --  9.0 8.8* 8.8* 9.0  MG  --  1.3*  --   --  1.5*  --    GFR: Estimated Creatinine Clearance (by C-G formula based on SCr of 0.63 mg/dL) Female: 16.1 mL/min Female: 62.9 mL/min Liver Function Tests:  Recent Labs Lab 05/16/16 0201  AST 36  ALT 17  ALKPHOS 154*  BILITOT 0.8  PROT 5.9*  ALBUMIN 3.1*   No results for input(s): LIPASE, AMYLASE in the last 168 hours. No results for input(s): AMMONIA in the last 168 hours. Coagulation Profile: No results for input(s): INR, PROTIME in the last 168 hours. Cardiac Enzymes: No results for input(s): CKTOTAL, CKMB, CKMBINDEX, TROPONINI in the last 168 hours. BNP (last 3 results) No results for input(s): PROBNP in the last 8760 hours. HbA1C: No results for input(s): HGBA1C in the last 72 hours. CBG:  Recent Labs Lab 05/18/16 0728 05/18/16 1233 05/18/16 1647 05/18/16 2104 05/19/16 0631  GLUCAP 217* 185* 202* 168* 171*   Lipid Profile: No results for input(s): CHOL, HDL, LDLCALC, TRIG, CHOLHDL, LDLDIRECT in the  last 72 hours. Thyroid Function Tests: No results for input(s): TSH, T4TOTAL, FREET4, T3FREE, THYROIDAB in the last 72 hours. Anemia Panel: No results for input(s): VITAMINB12, FOLATE, FERRITIN, TIBC, IRON, RETICCTPCT in the last 72 hours. Sepsis Labs:  Recent Labs Lab 05/16/16 0330 05/16/16 1129 05/16/16 2009 05/17/16 0030  LATICACIDVEN 2.11* 2.8* 2.2* 0.9    Recent Results (from the past 240 hour(s))  Culture, Urine     Status: Abnormal   Collection Time: 05/16/16  2:08 AM  Result Value Ref Range Status   Specimen Description URINE, RANDOM  Final   Special Requests NONE  Final   Culture MULTIPLE SPECIES PRESENT, SUGGEST RECOLLECTION (A)  Final   Report Status 05/17/2016 FINAL  Final  Blood culture (routine x 2)     Status: None (Preliminary result)  Collection Time: 05/16/16  3:24 AM  Result Value Ref Range Status   Specimen Description BLOOD LEFT ARM  Final   Special Requests IN PEDIATRIC BOTTLE 4ML  Final   Culture NO GROWTH 2 DAYS  Final   Report Status PENDING  Incomplete  Blood culture (routine x 2)     Status: None (Preliminary result)   Collection Time: 05/16/16  3:25 AM  Result Value Ref Range Status   Specimen Description BLOOD RIGHT FOREARM  Final   Special Requests BOTTLES DRAWN AEROBIC AND ANAEROBIC 5ML  Final   Culture NO GROWTH 2 DAYS  Final   Report Status PENDING  Incomplete         Radiology Studies: No results found.      Scheduled Meds: . aspirin EC  81 mg Oral Daily  . atorvastatin  40 mg Oral QHS  . cefTRIAXone (ROCEPHIN)  IV  1 g Intravenous Q24H  . cyanocobalamin  1,000 mcg Intramuscular Daily  . enoxaparin (LOVENOX) injection  40 mg Subcutaneous Q24H  . feeding supplement (GLUCERNA SHAKE)  237 mL Oral BID BM  . insulin aspart  0-5 Units Subcutaneous QHS  . insulin aspart  0-9 Units Subcutaneous TID WC  . lisinopril  20 mg Oral Daily  . potassium chloride  20 mEq Oral BID  . sodium chloride flush  3 mL Intravenous Q12H  . sodium  chloride  1 g Oral BID WC   Continuous Infusions:     LOS: 3 days    Time spent: 26 minutes.   Klever Twyford Jaynie CollinsPrasad Annalei Friesz, MD [facility] Pager [phone]  If 7PM-7AM, please contact night-coverage [email or web address] Password [email or web address] 05/19/2016, 1:27 PM

## 2016-05-19 NOTE — Care Management Important Message (Signed)
Important Message  Patient Details  Name: Ellen FaceBarbara J Brager MRN: [id number] Date of Birth: 12/27/1935   Medicare Important Message Given:  Yes    Woodie Degraffenreid 05/19/2016, 11:06 AM

## 2016-05-20 DIAGNOSIS — E440 Moderate protein-calorie malnutrition: Secondary | ICD-10-CM

## 2016-05-20 LAB — BASIC METABOLIC PANEL
Anion gap: 12 (ref 5–15)
BUN: 17 mg/dL (ref 6–20)
CHLORIDE: 92 mmol/L — AB (ref 101–111)
CO2: 24 mmol/L (ref 22–32)
Calcium: 9.1 mg/dL (ref 8.9–10.3)
Creatinine, Ser: 0.66 mg/dL (ref 0.44–1.00)
GFR calc Af Amer: >60 mL/min (ref >60)
GFR calc non Af Amer: >60 mL/min (ref >60)
GLUCOSE: 186 mg/dL — AB (ref 65–99)
POTASSIUM: 4.8 mmol/L (ref 3.5–5.1)
SODIUM: 128 mmol/L — AB (ref 135–145)

## 2016-05-20 LAB — BLOOD CULTURE ID PANEL (REFLEXED)
Acinetobacter baumannii: NOT DETECTED
CANDIDA KRUSEI: NOT DETECTED
Candida albicans: NOT DETECTED
Candida glabrata: NOT DETECTED
Candida parapsilosis: NOT DETECTED
Candida tropicalis: NOT DETECTED
ENTEROBACTER CLOACAE COMPLEX: NOT DETECTED
ENTEROCOCCUS SPECIES: NOT DETECTED
ESCHERICHIA COLI: NOT DETECTED
Enterobacteriaceae species: NOT DETECTED
Haemophilus influenzae: NOT DETECTED
Klebsiella oxytoca: NOT DETECTED
Klebsiella pneumoniae: NOT DETECTED
LISTERIA MONOCYTOGENES: NOT DETECTED
Neisseria meningitidis: NOT DETECTED
PSEUDOMONAS AERUGINOSA: NOT DETECTED
Proteus species: NOT DETECTED
SERRATIA MARCESCENS: NOT DETECTED
STAPHYLOCOCCUS AUREUS BCID: NOT DETECTED
STREPTOCOCCUS PNEUMONIAE: NOT DETECTED
STREPTOCOCCUS PYOGENES: NOT DETECTED
Staphylococcus species: NOT DETECTED
Streptococcus agalactiae: NOT DETECTED
Streptococcus species: NOT DETECTED

## 2016-05-20 LAB — GLUCOSE, CAPILLARY
GLUCOSE-CAPILLARY: 224 mg/dL — AB (ref 65–99)
Glucose-Capillary: 189 mg/dL — ABNORMAL HIGH (ref 65–99)
Glucose-Capillary: 193 mg/dL — ABNORMAL HIGH (ref 65–99)
Glucose-Capillary: 217 mg/dL — ABNORMAL HIGH (ref 65–99)

## 2016-05-20 LAB — MAGNESIUM: Magnesium: 1.6 mg/dL — ABNORMAL LOW (ref 1.7–2.4)

## 2016-05-20 MED ORDER — MAGNESIUM SULFATE 2 GM/50ML IV SOLN
2.0000 g | Freq: Once | INTRAVENOUS | Status: AC
  Administered 2016-05-20: 2 g via INTRAVENOUS
  Filled 2016-05-20: qty 50

## 2016-05-20 MED ORDER — SODIUM CHLORIDE 1 G PO TABS
1.0000 g | ORAL_TABLET | Freq: Three times a day (TID) | ORAL | Status: DC
  Administered 2016-05-20 – 2016-05-22 (×7): 1 g via ORAL
  Filled 2016-05-20 (×8): qty 1

## 2016-05-20 NOTE — Progress Notes (Signed)
PHARMACY - PHYSICIAN COMMUNICATION CRITICAL VALUE ALERT - BLOOD CULTURE IDENTIFICATION (BCID)  Results for orders placed or performed during the hospital encounter of 05/16/16  Blood Culture ID Panel (Reflexed) (Collected: 05/16/2016  3:25 AM)  Result Value Ref Range   Enterococcus species NOT DETECTED NOT DETECTED   Listeria monocytogenes NOT DETECTED NOT DETECTED   Staphylococcus species NOT DETECTED NOT DETECTED   Staphylococcus aureus NOT DETECTED NOT DETECTED   Streptococcus species NOT DETECTED NOT DETECTED   Streptococcus agalactiae NOT DETECTED NOT DETECTED   Streptococcus pneumoniae NOT DETECTED NOT DETECTED   Streptococcus pyogenes NOT DETECTED NOT DETECTED   Acinetobacter baumannii NOT DETECTED NOT DETECTED   Enterobacteriaceae species NOT DETECTED NOT DETECTED   Enterobacter cloacae complex NOT DETECTED NOT DETECTED   Escherichia coli NOT DETECTED NOT DETECTED   Klebsiella oxytoca NOT DETECTED NOT DETECTED   Klebsiella pneumoniae NOT DETECTED NOT DETECTED   Proteus species NOT DETECTED NOT DETECTED   Serratia marcescens NOT DETECTED NOT DETECTED   Haemophilus influenzae NOT DETECTED NOT DETECTED   Neisseria meningitidis NOT DETECTED NOT DETECTED   Pseudomonas aeruginosa NOT DETECTED NOT DETECTED   Candida albicans NOT DETECTED NOT DETECTED   Candida glabrata NOT DETECTED NOT DETECTED   Candida krusei NOT DETECTED NOT DETECTED   Candida parapsilosis NOT DETECTED NOT DETECTED   Candida tropicalis NOT DETECTED NOT DETECTED    Name of physician (or Provider) Contacted: Dr. Ronalee BeltsBhandari  Changes to prescribed antibiotics required: None 1/2 GPR. BCID neg. Prob a contaminant. FYI paged Dr. Marylu LundBhandari  Ellen Payne, PharmD Pager: [phone] 05/20/2016 8:53 AM

## 2016-05-20 NOTE — Progress Notes (Signed)
PROGRESS NOTE    Ellen Payne  ZOX:[id number] DOB: September 17, 1935 DOA: 05/16/2016 PCP: Cala Bradford, MD  Brief Narrative: 80 year old female with history of type 2 diabetes, hypertension who presented with back pain, lower leg weakness and fever. In the ER patient with tachycardia, hyponatremia, leukocytosis, elevated lactate level and positive UTI. Patient is being treated for sepsis due to UTI. She has abnormal MRI finding with a degenerative changes in her lower back screening pain and lower extremity weakness.   Assessment & Plan:   # Presumed Sepsis due to UTI (? Acute cystitis without hematuria):  - 1/2 bottle of blood culture growing gram positive rod, likely contaminant. Follow up final plan. - CT a/p and chest result reviewed.  -continue IV ceftriaxone. Clinically improving  # Possible acute metabolic encephalopathy in the setting of infection: Patient was alert awake and oriented to September 2017, Hospital and name today. She has no focal neurological deficit. I will continue current medical management. Stable today -TSH acceptable -on b12 injection   # Lower back pain and b/l leg weakness. MRI lumbar result reviewed, consistent with b/l sacral insufficiency fractures, moderate canal stenosis and chronic degenerative changes.  -Pt, OT evaluation ongoing.  -Social worker ongoing for safe discharge to SNF.  # Hyponatremia, hypokalemia : Hypokalemia improved. Serum sodium level did not improve with normal saline. Patient has urine osmolality of 395 with high urine sodium level. Possible SIADH in the setting of back pain. TSH level acceptable. Patient is euvolemic on physical exam.  -fluid restriction to 1200 cc/24 hours and increase the salt tabs to TID. Monitor BMP.  -Education provided to the patient regarding fluid restriction. Patient is medically oral intake.  -Patient was on hydrochlorothiazide at home however not receiving in the hospital. I'm planning to discontinue  hydrochlorothiazide on discharge.  # Hypomagnesemia: mg 1.6 today, I will replete 2 g of mgsou4 today.   # Type 2 diabetes mellitus without complication, without long-term current use of insulin (HCC):Continue current insulin regimen. Monitor blood sugar level.  -metformin on hold.   # HTN, essential: Blood pressure acceptable. Continue lisinopril. Monitor blood pressure.  # Possible mild to moderate protein calorie malnutrition in the setting of chronic illness. Dietary referral.    DVT prophylaxis: lovenox sq Code Status: Full Family Communication: Discussed with the patient's daughter and patient's sister at bedside. Disposition Plan: Likely discharge to SNF in 1-2 days.   Consultants:   None   Antimicrobials:ceftriaxone.  Subjective: Patient was seen and examined at bedside. Denied headache, dizziness, nausea, vomiting, chest pain, shortness of breath. Patient's daughter at bedside. Objective: Vitals:   05/19/16 0917 05/19/16 2008 05/20/16 0510 05/20/16 1148  BP: 137/90 134/63 (!) 151/74 134/63  Pulse: 97 87 95 87  Resp: 16 16 18 16   Temp: 97.9 F (36.6 C) 98 F (36.7 C) 98 F (36.7 C) 98.2 F (36.8 C)  TempSrc: Oral Oral Oral Oral  SpO2: 96% 99% 96% 98%  Weight:   57.9 kg (127 lb 11.2 oz)   Height:        Intake/Output Summary (Last 24 hours) at 05/20/16 1459 Last data filed at 05/20/16 1342  Gross per 24 hour  Intake              900 ml  Output             1425 ml  Net             -525 ml   Filed Weights   05/18/16 0553  05/19/16 0413 05/20/16 0510  Weight: 58.7 kg (129 lb 6.4 oz) 58.4 kg (128 lb 12.8 oz) 57.9 kg (127 lb 11.2 oz)    Examination:  General exam: Pleasant elderly female, not in distress Respiratory system: Bilateral clear lungs, no wheezing Cardiovascular system: Regular rate rhythm, S1-S2 normal Gastrointestinal system: Soft, bowel sounds positive, nontender. Central nervous system: Alert, awake, oriented. No focal neurological  deficit. Extremities: 5/5 in all extremities. Skin: No rashes, lesions or ulcers    Data Reviewed: I have personally reviewed following labs and imaging studies  CBC:  Recent Labs Lab 05/16/16 0201 05/16/16 1603 05/17/16 0030 05/19/16 0500  WBC 12.2*  --  10.6* 8.9  NEUTROABS 10.4*  --   --   --   HGB 11.1*  --  10.3* 11.7*  HCT 33.3* 30.0* 31.5* 35.9*  MCV 92.0  --  93.2 94.5  PLT 655*  --  574* 538*   Basic Metabolic Panel:  Recent Labs Lab 05/16/16 0319 05/16/16 1129 05/17/16 0030 05/18/16 0521 05/19/16 0500 05/20/16 0225  NA  --  131* 129* 129* 128* 128*  K  --  3.0* 3.9 4.6 4.6 4.8  CL  --  88* 91* 92* 92* 92*  CO2  --  31 29 25 26 24   GLUCOSE  --  124* 123* 253* 187* 186*  BUN  --  15 16 14 10 17   CREATININE  --  0.73 0.77 0.69 0.63 0.66  CALCIUM  --  9.0 8.8* 8.8* 9.0 9.1  MG 1.3*  --   --  1.5*  --  1.6*   GFR: Estimated Creatinine Clearance (by C-G formula based on SCr of 0.66 mg/dL) Female: 53 mL/min Female: 62.3 mL/min Liver Function Tests:  Recent Labs Lab 05/16/16 0201  AST 36  ALT 17  ALKPHOS 154*  BILITOT 0.8  PROT 5.9*  ALBUMIN 3.1*   No results for input(s): LIPASE, AMYLASE in the last 168 hours. No results for input(s): AMMONIA in the last 168 hours. Coagulation Profile: No results for input(s): INR, PROTIME in the last 168 hours. Cardiac Enzymes: No results for input(s): CKTOTAL, CKMB, CKMBINDEX, TROPONINI in the last 168 hours. BNP (last 3 results) No results for input(s): PROBNP in the last 8760 hours. HbA1C: No results for input(s): HGBA1C in the last 72 hours. CBG:  Recent Labs Lab 05/19/16 0631 05/19/16 1719 05/19/16 2059 05/20/16 0628 05/20/16 1152  GLUCAP 171* 229* 186* 189* 193*   Lipid Profile: No results for input(s): CHOL, HDL, LDLCALC, TRIG, CHOLHDL, LDLDIRECT in the last 72 hours. Thyroid Function Tests: No results for input(s): TSH, T4TOTAL, FREET4, T3FREE, THYROIDAB in the last 72 hours. Anemia  Panel: No results for input(s): VITAMINB12, FOLATE, FERRITIN, TIBC, IRON, RETICCTPCT in the last 72 hours. Sepsis Labs:  Recent Labs Lab 05/16/16 0330 05/16/16 1129 05/16/16 2009 05/17/16 0030  LATICACIDVEN 2.11* 2.8* 2.2* 0.9    Recent Results (from the past 240 hour(s))  Culture, Urine     Status: Abnormal   Collection Time: 05/16/16  2:08 AM  Result Value Ref Range Status   Specimen Description URINE, RANDOM  Final   Special Requests NONE  Final   Culture MULTIPLE SPECIES PRESENT, SUGGEST RECOLLECTION (A)  Final   Report Status 05/17/2016 FINAL  Final  Blood culture (routine x 2)     Status: None (Preliminary result)   Collection Time: 05/16/16  3:24 AM  Result Value Ref Range Status   Specimen Description BLOOD LEFT ARM  Final   Special  Requests IN PEDIATRIC BOTTLE  Final   Culture NO GROWTH 4 DAYS  Final   Report Status PENDING  Incomplete  Blood culture (routine x 2)     Status: None (Preliminary result)   Collection Time: 05/16/16  3:25 AM  Result Value Ref Range Status   Specimen Description BLOOD RIGHT FOREARM  Final   Special Requests BOTTLES DRAWN AEROBIC AND ANAEROBIC  Final   Culture  Setup Time   Final    GRAM POSITIVE RODS ANAEROBIC BOTTLE ONLY CRITICAL RESULT CALLED TO, READ BACK BY AND VERIFIED WITH: M [employer] AT 1610 05/20/16 BY L BENFIELD    Culture GRAM POSITIVE RODS  Final   Report Status PENDING  Incomplete  Blood Culture ID Panel (Reflexed)     Status: None   Collection Time: 05/16/16  3:25 AM  Result Value Ref Range Status   Enterococcus species NOT DETECTED NOT DETECTED Final   Listeria monocytogenes NOT DETECTED NOT DETECTED Final   Staphylococcus species NOT DETECTED NOT DETECTED Final   Staphylococcus aureus NOT DETECTED NOT DETECTED Final   Streptococcus species NOT DETECTED NOT DETECTED Final   Streptococcus agalactiae NOT DETECTED NOT DETECTED Final   Streptococcus pneumoniae NOT DETECTED NOT DETECTED Final   Streptococcus  pyogenes NOT DETECTED NOT DETECTED Final   Acinetobacter baumannii NOT DETECTED NOT DETECTED Final   Enterobacteriaceae species NOT DETECTED NOT DETECTED Final   Enterobacter cloacae complex NOT DETECTED NOT DETECTED Final   Escherichia coli NOT DETECTED NOT DETECTED Final   Klebsiella oxytoca NOT DETECTED NOT DETECTED Final   Klebsiella pneumoniae NOT DETECTED NOT DETECTED Final   Proteus species NOT DETECTED NOT DETECTED Final   Serratia marcescens NOT DETECTED NOT DETECTED Final   Haemophilus influenzae NOT DETECTED NOT DETECTED Final   Neisseria meningitidis NOT DETECTED NOT DETECTED Final   Pseudomonas aeruginosa NOT DETECTED NOT DETECTED Final   Candida albicans NOT DETECTED NOT DETECTED Final   Candida glabrata NOT DETECTED NOT DETECTED Final   Candida krusei NOT DETECTED NOT DETECTED Final   Candida parapsilosis NOT DETECTED NOT DETECTED Final   Candida tropicalis NOT DETECTED NOT DETECTED Final         Radiology Studies: No results found.      Scheduled Meds: . aspirin EC  81 mg Oral Daily  . atorvastatin  40 mg Oral QHS  . cefTRIAXone (ROCEPHIN)  IV  1 g Intravenous Q24H  . cyanocobalamin  1,000 mcg Intramuscular Daily  . enoxaparin (LOVENOX) injection  40 mg Subcutaneous Q24H  . feeding supplement (GLUCERNA SHAKE)  237 mL Oral BID BM  . insulin aspart  0-5 Units Subcutaneous QHS  . insulin aspart  0-9 Units Subcutaneous TID WC  . lisinopril  20 mg Oral Daily  . potassium chloride  20 mEq Oral BID  . sodium chloride flush  3 mL Intravenous Q12H  . sodium chloride  1 g Oral TID WC   Continuous Infusions:     LOS: 4 days    Time spent: 25 minutes.   Ozella Comins Jaynie Collins, MD [facility] Pager [phone]  If 7PM-7AM, please contact night-coverage [email or web address] Password [email or web address] 05/20/2016, 2:59 PM

## 2016-05-21 LAB — GLUCOSE, CAPILLARY
GLUCOSE-CAPILLARY: 231 mg/dL — AB (ref 65–99)
Glucose-Capillary: 170 mg/dL — ABNORMAL HIGH (ref 65–99)
Glucose-Capillary: 209 mg/dL — ABNORMAL HIGH (ref 65–99)
Glucose-Capillary: 239 mg/dL — ABNORMAL HIGH (ref 65–99)

## 2016-05-21 LAB — BASIC METABOLIC PANEL
Anion gap: 11 (ref 5–15)
BUN: 23 mg/dL — AB (ref 6–20)
CHLORIDE: 94 mmol/L — AB (ref 101–111)
CO2: 24 mmol/L (ref 22–32)
CREATININE: 0.9 mg/dL (ref 0.44–1.00)
Calcium: 9.1 mg/dL (ref 8.9–10.3)
GFR calc Af Amer: >60 mL/min (ref >60)
GFR calc non Af Amer: 60 mL/min — ABNORMAL LOW (ref >60)
Glucose, Bld: 144 mg/dL — ABNORMAL HIGH (ref 65–99)
Potassium: 4.7 mmol/L (ref 3.5–5.1)
Sodium: 129 mmol/L — ABNORMAL LOW (ref 135–145)

## 2016-05-21 LAB — CULTURE, BLOOD (ROUTINE X 2): CULTURE: NO GROWTH

## 2016-05-21 LAB — MAGNESIUM: Magnesium: 2.1 mg/dL (ref 1.7–2.4)

## 2016-05-21 NOTE — Progress Notes (Signed)
PROGRESS NOTE    Ellen Payne  ZOX:[id number] DOB: 1935/12/19 DOA: 05/16/2016 PCP: Cala Bradford, MD  Brief Narrative: 80 year old female with history of type 2 diabetes, hypertension who presented with back pain, lower leg weakness and fever. In the ER patient with tachycardia, hyponatremia, leukocytosis, elevated lactate level and positive UTI. Patient is being treated for sepsis due to UTI. She has abnormal MRI finding with a degenerative changes in her lower back screening pain and lower extremity weakness.  -awaiting SW evaluation/confirmation for SNF, final culture result and Serum sodium level to improve.  Assessment & Plan:   # Presumed Sepsis due to UTI (? Acute cystitis without hematuria):  - 1/2 bottle of blood culture growing gram positive rod, likely contaminant. Follow up final culture result. - CT a/p and chest result reviewed.  -continue IV ceftriaxone, may consider discontinuing tomorrow after completing total 7 days course/ -clinically improving.   # Possible acute metabolic encephalopathy in the setting of infection: Patient was alert, awake, oriented to [facility] hospital, September 2017 and name.  She has no focal neurological deficit. I will continue current medical management. Stable today -TSH acceptable -on b12 injection   # Lower back pain and b/l leg weakness. MRI lumbar result reviewed, consistent with b/l sacral insufficiency fractures, moderate canal stenosis and chronic degenerative changes.  -Pt, OT evaluation and treatment ongoing.  -Social worker ongoing for safe discharge to SNF.  # Hyponatremia, hypokalemia : Hypokalemia improved. Serum sodium level did not improve with normal saline. Patient has urine osmolality of 395 with high urine sodium level. Possible SIADH in the setting of back pain. TSH level acceptable. Patient is euvolemic on physical exam and she has good oral intake. -Continue to do fluid restriction to 1200 cc/24 hours and  salt tabs to  TID.  -Monitor BMP, serum sodium level 129 today. -Education provided to the patient regarding fluid restriction. Patient is medically oral intake.  -Patient was on hydrochlorothiazide at home however not receiving in the hospital. I'm planning to discontinue hydrochlorothiazide on discharge. Discussed with the patient and her daughter.  # Hypomagnesemia: Mg level acceptable today.   # Type 2 diabetes mellitus without complication, without long-term current use of insulin (HCC):Continue current insulin regimen. Monitor blood sugar level.  -metformin on hold.   # HTN, essential: Blood pressure acceptable. Continue lisinopril. Monitor blood pressure.  # Possible mild to moderate protein calorie malnutrition in the setting of chronic illness. Dietary referral.    DVT prophylaxis: lovenox sq Code Status: Full Family Communication: Discussed with the patient's daughter at bedside.  Disposition Plan: Likely discharge to SNF in 1-2 days. Awaiting SW confirmation of SNF.   Consultants:   None   Antimicrobials:ceftriaxone.  Subjective: Patient was seen and examined at bedside. Denied fever, chills, headache, dizziness, nausea, vomiting, chest pain or shortness of breath. Back pain is controlled. Patient's daughter at bedside. Objective: Vitals:   05/20/16 2030 05/21/16 0400 05/21/16 0410 05/21/16 1126  BP: (!) 142/64  (!) 154/73 (!) 145/73  Pulse: 94  90 84  Resp: 18  18 18   Temp: 98.1 F (36.7 C)  98.3 F (36.8 C) 97.5 F (36.4 C)  TempSrc: Oral  Oral Oral  SpO2: 97%  98% 99%  Weight:  58.3 kg (128 lb 8 oz)    Height:        Intake/Output Summary (Last 24 hours) at 05/21/16 1314 Last data filed at 05/21/16 1130  Gross per 24 hour  Intake  1010 ml  Output             1500 ml  Net             -490 ml   Filed Weights   05/19/16 0413 05/20/16 0510 05/21/16 0400  Weight: 58.4 kg (128 lb 12.8 oz) 57.9 kg (127 lb 11.2 oz) 58.3 kg (128 lb 8 oz)     Examination:  General exam: Pleasant female lying on bed, not in distress. Respiratory system: Clear to auscultation bilaterally, no wheezing or crackles appreciated. Cardiovascular system: Regular rate and rhythm, S1-S2 normal. Gastrointestinal system: Abdomen soft, nontender, bowel sounds positive. Central nervous system: Alert, awake, oriented. No focal neurological deficit.Marland Kitchen. Extremities: 5/5 in all extremities. Skin: No rashes, lesions or ulcers    Data Reviewed: I have personally reviewed following labs and imaging studies  CBC:  Recent Labs Lab 05/16/16 0201 05/16/16 1603 05/17/16 0030 05/19/16 0500  WBC 12.2*  --  10.6* 8.9  NEUTROABS 10.4*  --   --   --   HGB 11.1*  --  10.3* 11.7*  HCT 33.3* 30.0* 31.5* 35.9*  MCV 92.0  --  93.2 94.5  PLT 655*  --  574* 538*   Basic Metabolic Panel:  Recent Labs Lab 05/16/16 0319  05/17/16 0030 05/18/16 0521 05/19/16 0500 05/20/16 0225 05/21/16 0245  NA  --   < > 129* 129* 128* 128* 129*  K  --   < > 3.9 4.6 4.6 4.8 4.7  CL  --   < > 91* 92* 92* 92* 94*  CO2  --   < > 29 25 26 24 24   GLUCOSE  --   < > 123* 253* 187* 186* 144*  BUN  --   < > 16 14 10 17  23*  CREATININE  --   < > 0.77 0.69 0.63 0.66 0.90  CALCIUM  --   < > 8.8* 8.8* 9.0 9.1 9.1  MG 1.3*  --   --  1.5*  --  1.6* 2.1  < > = values in this interval not displayed. GFR: Estimated Creatinine Clearance (by C-G formula based on SCr of 0.9 mg/dL) Female: 30.847.4 mL/min Female: 55.8 mL/min Liver Function Tests:  Recent Labs Lab 05/16/16 0201  AST 36  ALT 17  ALKPHOS 154*  BILITOT 0.8  PROT 5.9*  ALBUMIN 3.1*   No results for input(s): LIPASE, AMYLASE in the last 168 hours. No results for input(s): AMMONIA in the last 168 hours. Coagulation Profile: No results for input(s): INR, PROTIME in the last 168 hours. Cardiac Enzymes: No results for input(s): CKTOTAL, CKMB, CKMBINDEX, TROPONINI in the last 168 hours. BNP (last 3 results) No results for  input(s): PROBNP in the last 8760 hours. HbA1C: No results for input(s): HGBA1C in the last 72 hours. CBG:  Recent Labs Lab 05/20/16 1152 05/20/16 1630 05/20/16 2151 05/21/16 0633 05/21/16 1126  GLUCAP 193* 224* 217* 170* 209*   Lipid Profile: No results for input(s): CHOL, HDL, LDLCALC, TRIG, CHOLHDL, LDLDIRECT in the last 72 hours. Thyroid Function Tests: No results for input(s): TSH, T4TOTAL, FREET4, T3FREE, THYROIDAB in the last 72 hours. Anemia Panel: No results for input(s): VITAMINB12, FOLATE, FERRITIN, TIBC, IRON, RETICCTPCT in the last 72 hours. Sepsis Labs:  Recent Labs Lab 05/16/16 0330 05/16/16 1129 05/16/16 2009 05/17/16 0030  LATICACIDVEN 2.11* 2.8* 2.2* 0.9    Recent Results (from the past 240 hour(s))  Culture, Urine     Status: Abnormal   Collection Time:  05/16/16  2:08 AM  Result Value Ref Range Status   Specimen Description URINE, RANDOM  Final   Special Requests NONE  Final   Culture MULTIPLE SPECIES PRESENT, SUGGEST RECOLLECTION (A)  Final   Report Status 05/17/2016 FINAL  Final  Blood culture (routine x 2)     Status: None   Collection Time: 05/16/16  3:24 AM  Result Value Ref Range Status   Specimen Description BLOOD LEFT ARM  Final   Special Requests IN PEDIATRIC BOTTLE  Final   Culture NO GROWTH 5 DAYS  Final   Report Status 05/21/2016 FINAL  Final  Blood culture (routine x 2)     Status: None (Preliminary result)   Collection Time: 05/16/16  3:25 AM  Result Value Ref Range Status   Specimen Description BLOOD RIGHT FOREARM  Final   Special Requests BOTTLES DRAWN AEROBIC AND ANAEROBIC  Final   Culture  Setup Time   Final    GRAM POSITIVE RODS ANAEROBIC BOTTLE ONLY CRITICAL RESULT CALLED TO, READ BACK BY AND VERIFIED WITH: M [facility] AT 1610 05/20/16 BY L BENFIELD    Culture   Final    GRAM POSITIVE RODS CULTURE REINCUBATED FOR BETTER GROWTH    Report Status PENDING  Incomplete  Blood Culture ID Panel (Reflexed)     Status:  None   Collection Time: 05/16/16  3:25 AM  Result Value Ref Range Status   Enterococcus species NOT DETECTED NOT DETECTED Final   Listeria monocytogenes NOT DETECTED NOT DETECTED Final   Staphylococcus species NOT DETECTED NOT DETECTED Final   Staphylococcus aureus NOT DETECTED NOT DETECTED Final   Streptococcus species NOT DETECTED NOT DETECTED Final   Streptococcus agalactiae NOT DETECTED NOT DETECTED Final   Streptococcus pneumoniae NOT DETECTED NOT DETECTED Final   Streptococcus pyogenes NOT DETECTED NOT DETECTED Final   Acinetobacter baumannii NOT DETECTED NOT DETECTED Final   Enterobacteriaceae species NOT DETECTED NOT DETECTED Final   Enterobacter cloacae complex NOT DETECTED NOT DETECTED Final   Escherichia coli NOT DETECTED NOT DETECTED Final   Klebsiella oxytoca NOT DETECTED NOT DETECTED Final   Klebsiella pneumoniae NOT DETECTED NOT DETECTED Final   Proteus species NOT DETECTED NOT DETECTED Final   Serratia marcescens NOT DETECTED NOT DETECTED Final   Haemophilus influenzae NOT DETECTED NOT DETECTED Final   Neisseria meningitidis NOT DETECTED NOT DETECTED Final   Pseudomonas aeruginosa NOT DETECTED NOT DETECTED Final   Candida albicans NOT DETECTED NOT DETECTED Final   Candida glabrata NOT DETECTED NOT DETECTED Final   Candida krusei NOT DETECTED NOT DETECTED Final   Candida parapsilosis NOT DETECTED NOT DETECTED Final   Candida tropicalis NOT DETECTED NOT DETECTED Final         Radiology Studies: No results found.      Scheduled Meds: . aspirin EC  81 mg Oral Daily  . atorvastatin  40 mg Oral QHS  . cefTRIAXone (ROCEPHIN)  IV  1 g Intravenous Q24H  . cyanocobalamin  1,000 mcg Intramuscular Daily  . enoxaparin (LOVENOX) injection  40 mg Subcutaneous Q24H  . feeding supplement (GLUCERNA SHAKE)  237 mL Oral BID BM  . insulin aspart  0-5 Units Subcutaneous QHS  . insulin aspart  0-9 Units Subcutaneous TID WC  . lisinopril  20 mg Oral Daily  . potassium  chloride  20 mEq Oral BID  . sodium chloride flush  3 mL Intravenous Q12H  . sodium chloride  1 g Oral TID WC   Continuous Infusions:  LOS: 5 days    Time spent: 25 minutes.   Rayette Mogg Jaynie Collins, MD [facility] Pager [phone]  If 7PM-7AM, please contact night-coverage [email or web address] Password [email or web address] 05/21/2016, 1:14 PM

## 2016-05-21 NOTE — Clinical Social Work Note (Signed)
CSW services has assessed patient and sent out bed search.  [facility] [place] [facility] [place]  Awaiting stability per MD and will check on availability per SNF on Monday for possible admission to [facility].  Lorri Frederickonna T. Jaci LazierCrowder, LCSW [phone] (weekend coverage)

## 2016-05-22 LAB — GLUCOSE, CAPILLARY
GLUCOSE-CAPILLARY: 224 mg/dL — AB (ref 65–99)
GLUCOSE-CAPILLARY: 239 mg/dL — AB (ref 65–99)

## 2016-05-22 LAB — BASIC METABOLIC PANEL
ANION GAP: 9 (ref 5–15)
BUN: 22 mg/dL — ABNORMAL HIGH (ref 6–20)
CALCIUM: 9.1 mg/dL (ref 8.9–10.3)
CHLORIDE: 94 mmol/L — AB (ref 101–111)
CO2: 24 mmol/L (ref 22–32)
Creatinine, Ser: 0.65 mg/dL (ref 0.44–1.00)
GFR calc Af Amer: >60 mL/min (ref >60)
GFR calc non Af Amer: >60 mL/min (ref >60)
GLUCOSE: 187 mg/dL — AB (ref 65–99)
POTASSIUM: 4.8 mmol/L (ref 3.5–5.1)
Sodium: 127 mmol/L — ABNORMAL LOW (ref 135–145)

## 2016-05-22 MED ORDER — LISINOPRIL 20 MG PO TABS: 20.0000 mg | ORAL_TABLET | Freq: Every day | ORAL | Status: AC

## 2016-05-22 MED ORDER — ACETAMINOPHEN 325 MG PO TABS: 650.0000 mg | ORAL_TABLET | Freq: Four times a day (QID) | ORAL | Status: AC | PRN

## 2016-05-22 MED ORDER — GLUCERNA SHAKE PO LIQD: 237.0000 mL | Freq: Two times a day (BID) | ORAL | 0 refills | Status: AC

## 2016-05-22 MED ORDER — CYANOCOBALAMIN 1000 MCG/ML IJ SOLN: INTRAMUSCULAR | 0 refills | Status: AC

## 2016-05-22 MED ORDER — SODIUM CHLORIDE 1 G PO TABS: 1.0000 g | ORAL_TABLET | Freq: Two times a day (BID) | ORAL | Status: AC

## 2016-05-22 NOTE — Care Management Important Message (Signed)
Important Message  Patient Details  Name: Ellen FaceBarbara J Payne MRN: [id number] Date of Birth: 11/09/1935   Medicare Important Message Given:  Yes    [name] 05/22/2016, 10:50 AM

## 2016-05-22 NOTE — Discharge Instructions (Signed)
Follow with Primary MD Cala BradfordWHITE,CYNTHIA S, MD  after discharge from SNF  Get CBC, CMP,  checked  by Primary MD next visit.    Activity: As tolerated with Full fall precautions use walker/cane & assistance as needed   Disposition SNF   Diet: Heart Healthy , carb modified, with 1200 mL fluid restriction, may give one bottle of Gatorade out of this 1200 mL, with feeding assistance and aspiration precautions.  For Heart failure patients - Check your Weight same time everyday, if you gain over 2 pounds, or you develop in leg swelling, experience more shortness of breath or chest pain, call your Primary MD immediately. Follow Cardiac Low Salt Diet and 1.5 lit/day fluid restriction.   On your next visit with your primary care physician please Get Medicines reviewed and adjusted.   Please request your Prim.MD to go over all Hospital Tests and Procedure/Radiological results at the follow up, please get all Hospital records sent to your Prim MD by signing hospital release before you go home.   If you experience worsening of your admission symptoms, develop shortness of breath, life threatening emergency, suicidal or homicidal thoughts you must seek medical attention immediately by calling 911 or calling your MD immediately  if symptoms less severe.  You Must read complete instructions/literature along with all the possible adverse reactions/side effects for all the Medicines you take and that have been prescribed to you. Take any new Medicines after you have completely understood and accpet all the possible adverse reactions/side effects.   Do not drive, operating heavy machinery, perform activities at heights, swimming or participation in water activities or provide baby sitting services if your were admitted for syncope or siezures until you have seen by Primary MD or a Neurologist and advised to do so again.  Do not drive when taking Pain medications.    Do not take more than prescribed Pain,  Sleep and Anxiety Medications  Special Instructions: If you have smoked or chewed Tobacco  in the last 2 yrs please stop smoking, stop any regular Alcohol  and or any Recreational drug use.  Wear Seat belts while driving.   Please note  You were cared for by a hospitalist during your hospital stay. If you have any questions about your discharge medications or the care you received while you were in the hospital after you are discharged, you can call the unit and asked to speak with the hospitalist on call if the hospitalist that took care of you is not available. Once you are discharged, your primary care physician will handle any further medical issues. Please note that NO REFILLS for any discharge medications will be authorized once you are discharged, as it is imperative that you return to your primary care physician (or establish a relationship with a primary care physician if you do not have one) for your aftercare needs so that they can reassess your need for medications and monitor your lab values.

## 2016-05-22 NOTE — Clinical Social Work Note (Signed)
Per MD, patient stable for discharge. Facility notified. MD paged and notified that [facility] can take her today.  Ellen CourtSarah Keona Bilyeu, CSW [phone]

## 2016-05-22 NOTE — Discharge Summary (Signed)
Ellen Payne, is a 80 y.o. female  DOB [birth year]  MRN [id number].  Admission date:  05/16/2016  Admitting Physician  Edwin Dada, MD  Discharge Date:  05/22/2016   Primary MD  Vidal Schwalbe, MD  Recommendations for primary care physician for things to follow:  - Please check CBC, BMP in 3 days, stop salt tablets if sodium within normal range. - Continue with B-12 injection, check B-12 level in 4 weeks. - Please follow on the final results on blood cultures obtained 9/12, growing gram-positive rods, this is most likely contaminant, but final report still pending at time of discharge - for  PCP please follow-up on abnormal finding on CT chest abdomen and pelvis:   1-patient with mildly ectatic ascending thoracic aorta, recommendation is for annual imaging follow-up by CTA or MRA   2-1.9 cm cystic pancreatic head lesion, indeterminate. Consider follow-up contrast-enhanced abdominal MRI or pancreatic protocol CT in 2 years to assess stability.  Admission Diagnosis  Acute hyponatremia [E87.1] Hypokalemia [E87.6] Hypomagnesemia [E83.42] UTI (lower urinary tract infection) [N39.0] Inability to walk [R26.2]   Discharge Diagnosis  Acute hyponatremia [E87.1] Hypokalemia [E87.6] Hypomagnesemia [E83.42] UTI (lower urinary tract infection) [N39.0] Inability to walk [R26.2]    Principal Problem:   Sepsis ([place]) Active Problems:   Prolonged QT interval   Hypomagnesemia   Hypokalemia   Hyponatremia   Acute hyponatremia   UTI (lower urinary tract infection)   Anemia, unspecified   Type 2 diabetes mellitus without complication, without long-term current use of insulin (HCC)   Essential hypertension   Lower back pain   Encephalopathy, metabolic   Malnutrition of moderate degree      Past Medical History:  Diagnosis Date  . Anemia 05/2016  . Cataract   . Diabetes mellitus without  complication ([place])   . Hyperlipidemia   . Hypertension   . Hyponatremia 05/2016  . Osteoarthritis   . Osteopenia     Past Surgical History:  Procedure Laterality Date  . ABDOMINAL HYSTERECTOMY         History of present illness and  Hospital Course:     Kindly see H&P for history of present illness and admission details, please review complete Labs, Consult reports and Test reports for all details in brief  HPI  from the history and physical done on the day of admission 05/16/2016  HPI: Ellen Payne is a 80 y.o. female with a past medical history significant for NIDDM, HTN who presents with back pain and leg weakness and fever.  The patient was in her usual state of health until sometime in July, she fell on the stairs, and developed back pain afterwards. Since then she has had progressive low back pain, not improved with over-the-counter analgesics. One week ago she was seen in urgent care, lumbar radiographs were negative, she was diagnosed with UTI because of urinary symptoms and pyuria, started on cephalexin (which she completed, culture grew pan-sensitive E coli). She did have an elevated ESR, and so MRI of the lumbar  spine is planned.  Since then her pain has progressively gotten worse.  It is aching in the low back, worse with movement, radiating into both legs.  She has trouble walking because of pain.  She has had no fever, chills, nausea, vomiting.  She has had persistent urinary urgency and urinary frequency.  Per daughter, her husband has witnessed some episodes of bizarre behavior over the last 2 weeks (once being anxious for hours about having "8 cellphones" that she had to recharge).  Today, she went to her PCP who ordered blood work and called her later to tell her to go to the ER because of low sodium, elevated alkaline phosphatase, low potassium, and very low vitamin B12.  ED course: -Afebrile, heart rate 120s, respirations 18, blood pressure pulse oximetry  normal -Na 126 (no previous baseline), K 2.8, Cr 0.77 (baseline unknown), WBC 12.2 K, Hgb 10.1 -Magnesium 1.3, TSH normal -Urinalysis nitrites and leukocytes present -Lactic acid 2.11 -Radiographs of the hips, lumbar spine and sacrum showed only degenerative lumbar disease -Cultures were obtained, ceftriaxone was administered as well as supplemental K and magnesium and TRH were asked to evaluate for admission  Of note, daughter feels that she has had a progressive mental decline over the last year. She lives with her legally blind husband for whom she is the primary caregiver. She still drives, and walks without a cane or walker except for the last 2 weeks when she has been using a walker.   Hospital Course    Presumed Sepsis due to UTI (? Acute cystitis without hematuria):  - 1/2 bottle of blood culture growing gram positive rod, likely contaminant. Follow up final culture result. Therefore no final reports. -Treated with total of IV Rocephin 7 days during hospital stay, and no need for further antibiotics on discharge  -  sepsis resolved    # Possible acute metabolic encephalopathy in the setting of infection: Patient was alert, awake, oriented to [facility] hospital, September 2017 and name.  She has no focal neurological deficit. -TSH acceptable -on b12 injection   # B12 deficiency - B 12 level significantly low at 66, started on B-12 injections.   # Lower back pain and b/l leg weakness. MRI lumbar result reviewed, consistent with b/l sacral insufficiency fractures, moderate canal stenosis and chronic degenerative changes.  -Pt, OT evaluation and treatment ongoing.  -discharge to SNF. - The pain controlled with when necessary Tylenol  # Abnormal finding on CT chest abdomen and pelvis   1-patient with mildly ectatic ascending thoracic aorta, recommendation is for annual imaging follow-up by CTA or MRA   2-1.9 cm cystic pancreatic head lesion, indeterminate. Consider follow-up  contrast-enhanced abdominal MRI or pancreatic protocol CT in 2 years to assess stability.   # Hyponatremia, hypokalemia : Hypokalemia improved. Serum sodium level did not improve with normal saline. Patient has urine osmolality of 395 with high urine sodium level. Possible SIADH in the setting of back pain. TSH level acceptable. Patient is euvolemic on physical exam and she has good oral intake. -Continue to do fluid restriction to 1200 cc/24 hours and  salt tabs BID  -Monitor BMP, serum sodium level 127 today. -Education provided to the patient regarding fluid restriction. Patient is medically oral intake.  -Patient was on hydrochlorothiazide at home however not receiving in the hospital. I'm planning to discontinue hydrochlorothiazide on discharge. Discussed with the patient and her daughter.  # Hypomagnesemia: Repleted  # Type 2 diabetes mellitus without complication, without long-term current use of  insulin ([place]): Resume metformin on discharge  # HTN, essential: Blood pressure acceptable. Continue lisinopril. Monitor blood pressure.  # Possible mild to moderate protein calorie malnutrition in the setting of chronic illness      Discharge Condition:  Stable   Follow UP   Contact information for follow-up providers    Vidal Schwalbe, MD .   Specialty:  Family Medicine Why:  After discharge from SNF Contact information: [place] [phone]            Contact information for after-discharge care    Destination    [facility] .   Specialty:  [place] information: [place] [phone]                    Discharge Instructions  and  Discharge Medications     Discharge Instructions    Discharge instructions    Complete by:  As directed    Follow with Primary MD Vidal Schwalbe, MD  after discharge from  SNF  Get CBC, CMP,  checked  by Primary MD next visit.    Activity: As tolerated with Full fall precautions use walker/cane & assistance as needed   Disposition SNF   Diet: Heart Healthy , carb modified, with 1200 mL fluid restriction, may give one bottle of Gatorade out of this 1200 mL, with feeding assistance and aspiration precautions.  For Heart failure patients - Check your Weight same time everyday, if you gain over 2 pounds, or you develop in leg swelling, experience more shortness of breath or chest pain, call your Primary MD immediately. Follow Cardiac Low Salt Diet and 1.5 lit/day fluid restriction.   On your next visit with your primary care physician please Get Medicines reviewed and adjusted.   Please request your Prim.MD to go over all Hospital Tests and Procedure/Radiological results at the follow up, please get all Hospital records sent to your Prim MD by signing hospital release before you go home.   If you experience worsening of your admission symptoms, develop shortness of breath, life threatening emergency, suicidal or homicidal thoughts you must seek medical attention immediately by calling 911 or calling your MD immediately  if symptoms less severe.  You Must read complete instructions/literature along with all the possible adverse reactions/side effects for all the Medicines you take and that have been prescribed to you. Take any new Medicines after you have completely understood and accpet all the possible adverse reactions/side effects.   Do not drive, operating heavy machinery, perform activities at heights, swimming or participation in water activities or provide baby sitting services if your were admitted for syncope or siezures until you have seen by Primary MD or a Neurologist and advised to do so again.  Do not drive when taking Pain medications.    Do not take more than prescribed Pain, Sleep and Anxiety Medications  Special Instructions: If you have  smoked or chewed Tobacco  in the last 2 yrs please stop smoking, stop any regular Alcohol  and or any Recreational drug use.  Wear Seat belts while driving.   Please note  You were cared for by a hospitalist during your hospital stay. If you have any questions about your discharge medications or the care you received while you were in the hospital after you are discharged, you can call the unit and asked to speak with the hospitalist on call if the  hospitalist that took care of you is not available. Once you are discharged, your primary care physician will handle any further medical issues. Please note that NO REFILLS for any discharge medications will be authorized once you are discharged, as it is imperative that you return to your primary care physician (or establish a relationship with a primary care physician if you do not have one) for your aftercare needs so that they can reassess your need for medications and monitor your lab values.   Increase activity slowly    Complete by:  As directed        Medication List    STOP taking these medications   ACETAMINOPHEN 8 HOUR 650 MG CR tablet Generic drug:  acetaminophen Replaced by:  acetaminophen 325 MG tablet   lisinopril-hydrochlorothiazide 20-25 MG tablet Commonly known as:  PRINZIDE,ZESTORETIC     TAKE these medications   acetaminophen 325 MG tablet Commonly known as:  TYLENOL Take 2 tablets (650 mg total) by mouth every 6 (six) hours as needed for mild pain (or Fever >/= 101). Replaces:  ACETAMINOPHEN 8 HOUR 650 MG CR tablet   aspirin EC 81 MG tablet Take 81 mg by mouth daily.   atorvastatin 40 MG tablet Commonly known as:  LIPITOR Take 40 mg by mouth at bedtime.   cyanocobalamin 1000 MCG/ML injection Commonly known as:  (VITAMIN B-12) Please take once weekly every Monday for 4 weeks, then change to once monthly.   feeding supplement (GLUCERNA SHAKE) Liqd Take 237 mLs by mouth 2 (two) times daily between meals. Start  taking on:  05/23/2016   lisinopril 20 MG tablet Commonly known as:  PRINIVIL,ZESTRIL Take 1 tablet (20 mg total) by mouth daily. Start taking on:  05/23/2016   metFORMIN 500 MG tablet Commonly known as:  GLUCOPHAGE Take 1,000 mg by mouth 2 (two) times daily with a meal.   sodium chloride 1 g tablet Take 1 tablet (1 g total) by mouth 2 (two) times daily with a meal.   Vitamin D3 5000 units Tabs Take 1 tablet by mouth daily.         Diet and Activity recommendation: See Discharge Instructions above   Consults obtained -  none   Major procedures and Radiology Reports - PLEASE review detailed and final reports for all details, in brief -      Dg Lumbar Spine Complete  Result Date: 05/16/2016 CLINICAL DATA:  Bilateral hip, lower lumbosacral and sacral pain for 1 week. Progressive pain. No known injury. EXAM: LUMBAR SPINE - COMPLETE 4+ VIEW COMPARISON:  Radiographs 05/06/2016 FINDINGS: The degree of anterolisthesis of L5 on S1 is unchanged measuring 8 mm. There is associated disc space narrowing and facet arthropathy. Unchanged facet arthropathy throughout the lumbar spine additional levels. Suggestion of mild superior endplate concavity of L3 is unchanged. Milder disc space narrowing and endplate spurring at G6-Y4 and T12-L1. No acute fracture. Sclerotic focus noted in the sacrum. Atherosclerosis of the intra-abdominal vasculature. IMPRESSION: Stable degenerative change from radiographs 10 days prior. This is most prominent at L5-S1 with degenerative disc disease, facet arthropathy, and anterolisthesis. No acute bony abnormality. Electronically Signed   By: Jeb Levering M.D.   On: 05/16/2016 02:45   Dg Lumbar Spine Complete  Result Date: 05/06/2016 CLINICAL DATA:  Low back pain for 1 week. EXAM: LUMBAR SPINE - COMPLETE 4+ VIEW COMPARISON:  None. FINDINGS: No fractures identified. Spondylosis present, most prominently at L5-S1 with facet hypertrophy and anterolisthesis of L5 on S1  of approximately  8 mm. Associated disc space narrowing. Other levels show facet hypertrophy without severe disc space narrowing. Bones are diffusely osteopenic. There may be minimal loss of height of the mid L3 vertebral body, with scalloped appearance of the superior endplate. This does not appear acute. IMPRESSION: 1. Lumbar spondylosis, most prominently at L5-S1. There is a grade 1 anterolisthesis on a of L5 on S1 of approximately 8 mm. 2. Suggestion of mild loss of height at the level of the superior endplate of L3. This does not appear to be an acute compression fracture. Electronically Signed   By: Aletta Edouard M.D.   On: 05/06/2016 10:06   Dg Sacrum/coccyx  Result Date: 05/16/2016 CLINICAL DATA:  Bilateral hip, lower lumbosacral and sacral pain for 1 week. Progressive pain. No known injury. EXAM: SACRUM AND COCCYX - 2+ VIEW COMPARISON:  None. FINDINGS: There is no evidence of fracture. Sacral ala are maintained. Sclerotic density within the mid sacrum, favored to be benign bone island. Sacroiliac joints are congruent. IMPRESSION: No acute bony abnormality. Sclerotic focus in the mid sacrum, likely a benign bone island in the absence of known malignancy. Electronically Signed   By: Jeb Levering M.D.   On: 05/16/2016 02:40   Ct Chest W Contrast  Addendum Date: 05/16/2016   ADDENDUM REPORT: 05/16/2016 19:41 ADDENDUM: Mildly ectatic ascending thoracic aorta. Recommend annual imaging followup by CTA or MRA. This recommendation follows 2010 ACCF/AHA/AATS/ACR/ASA/SCA/SCAI/SIR/STS/SVM Guidelines for the Diagnosis and Management of Patients with Thoracic Aortic Disease. Circulation. 2010; 121: M629-U765 Electronically Signed   By: Logan Bores M.D.   On: 05/16/2016 19:41   Result Date: 05/16/2016 CLINICAL DATA:  80 pound weight loss over 1 year. Bilateral hip pain. EXAM: CT CHEST, ABDOMEN, AND PELVIS WITH CONTRAST TECHNIQUE: Multidetector CT imaging of the chest, abdomen and pelvis was performed  following the standard protocol during bolus administration of intravenous contrast. CONTRAST:  59m ISOVUE-300 IOPAMIDOL (ISOVUE-300) INJECTION 61% COMPARISON:  None. FINDINGS: CT CHEST FINDINGS Cardiovascular: Mild thoracic aortic atherosclerosis. Ascending thoracic aorta measures up to 4.0 cm in diameter. Three-vessel coronary artery atherosclerosis. Heart size is within normal limits. There is a small pericardial effusion. Mediastinum/Nodes: Diffusely heterogeneous thyroid with small nodules measuring up to 1.3 cm in size. No enlarged axillary, mediastinal, or hilar lymph nodes are identified. Lungs/Pleura: No pleural effusion. Major airways are patent. Partially calcified right upper lobe nodule measures 6 mm (series 205, image 30). A 2 mm calcified nodule is noted in the left lower lobe (series 25, image 76). There is mild respiratory motion artifact with mild dependent atelectasis in the lower lobes. Musculoskeletal: No chest wall mass or suspicious bone lesions identified. Thoracic spondylosis. Slight depression of the T4 and T7 superior endplates with subjacent osseous sclerosis. CT ABDOMEN PELVIS FINDINGS Hepatobiliary: Mildly decreased attenuation of the liver diffusely suggestive of steatosis, with likely small areas of more focal fatty deposition at the gallbladder fossa. Mildly distended gallbladder without evidence of wall thickening, pericholecystic inflammatory change, or biliary dilatation. Pancreas: 1.9 x 1.2 cm low-density/cystic lesion in the pancreatic head. No pancreatic ductal dilatation or atrophy. No peripancreatic inflammatory change. Spleen: Unremarkable. Adrenals/Urinary Tract: Adrenal glands are unremarkable. 1.4 cm right upper pole renal cyst. No hydronephrosis or renal calculi. Unremarkable bladder. Stomach/Bowel: Mild gaseous distention of the stomach without evidence of wall thickening. Oral contrast is present in multiple nondilated loops of small bowel. There is no evidence of  bowel obstruction or inflammatory process. A moderate amount of stool is present throughout the colon. The appendix is  unremarkable. Vascular/Lymphatic: Abdominal aortic atherosclerosis without aneurysm. No enlarged lymph nodes are identified. Reproductive: Status post hysterectomy. No adnexal masses. Other: No intraperitoneal free fluid.  No abdominal wall hernia. Musculoskeletal: Old posterior left twelfth rib fracture. Old displaced right L1 transverse process fracture. Deformity of the left L2 transverse process consistent with old fracture. Mildly angulated left L3 transverse process fracture and slightly distracted right L4 transverse process fracture, both of indeterminate age. Old left L4 transverse process fracture. Likely bilateral L5 transverse process fractures of indeterminate age. L3 superior endplate Schmorl's node deformity. Grade 1 anterolisthesis of L4 on L5 and L5 on S1, facet mediated. Bilateral L5-S1 facet ankylosis. Bilateral sacral insufficiency type fractures of indeterminate acuity though potentially recent/ unhealed. 1.5 cm densely sclerotic focus in the S3 segment, nonspecific but may represent a bone island. IMPRESSION: 1. No evidence of malignancy in the chest. 2. 1.9 cm cystic pancreatic head lesion, indeterminate. Consider follow-up contrast-enhanced abdominal MRI or pancreatic protocol CT in 2 years to assess stability. 3. Multiple transverse process fractures in the lumbar spine, many of which are chronic although some are of indeterminate age. 4. Bilateral sacral insufficiency fractures. 5. Mild T4 and T7 superior endplate fracture deformities of indeterminate age. 6. Small pericardial effusion. 7. Hepatic steatosis. 8. Aortic atherosclerosis. Electronically Signed: By: Logan Bores M.D. On: 05/16/2016 14:40   Mr Lumbar Spine Wo Contrast  Result Date: 05/16/2016 CLINICAL DATA:  Initial evaluation for acute back pain, inability to walk. EXAM: MRI LUMBAR SPINE WITHOUT CONTRAST  TECHNIQUE: Multiplanar, multisequence MR imaging of the lumbar spine was performed. No intravenous contrast was administered. COMPARISON:  Prior CT from earlier the same day. FINDINGS: Segmentation: Normal segmentation. Lowest well-formed disc is labeled the L5-S1 level. Alignment: 5 mm anterolisthesis of L5 on S1. Trace 2 mm anterolisthesis of L4 on L5. Exaggeration of the normal lumbar lordosis. Vertebrae: Abnormal T1 hypo intense, T2/STIR hyperintense signal intensity present within the bilateral sacral ala, compatible with acute insufficiency fractures. Chronic height loss at the superior endplate of L3. Vertebral body heights otherwise maintained. No acute vertebral body fracture. Signal intensity within the vertebral body bone marrow otherwise normal. No worrisome osseous lesions. Conus medullaris: Extends to the L2 level and appears normal. Paraspinal and other soft tissues: Paraspinous soft tissues demonstrate no acute abnormality. Fatty atrophy noted within the paraspinous musculature. Few scattered T2 hyperintense cyst noted within the kidneys. Disc levels: T12-L1:Diffuse degenerative disc bulge with disc desiccation and intervertebral disc space narrowing. No significant stenosis. L1-2: Diffuse disc bulge with disc desiccation. No focal disc herniation. No significant stenosis. L2-3: Diffuse degenerative disc bulge with disc desiccation. No focal disc protrusion. Mild facet and ligamentum flavum hypertrophy. Mild canal and bilateral foraminal narrowing. L3-4: Mild diffuse disc bulge with disc desiccation. No focal disc herniation. Mild bilateral facet arthrosis with ligamentum flavum hypertrophy. Resultant mild lateral recess stenosis bilaterally. Mild foraminal narrowing, left slightly worse than right related disc bulge and facet disease. L4-5: Trace 2 mm anterolisthesis of L4 on L5. Associated mild broad-based posterior disc bulge. Severe bilateral facet arthrosis with ligamentum flavum hypertrophy.  Resultant moderate canal stenosis. Mild bilateral foraminal narrowing related disc bulge and facet disease. L5-S1: 5 mm anterolisthesis of L5 on S1. Associated mild disc bulge with moderate bilateral facet arthrosis. No significant canal or foraminal stenosis. IMPRESSION: 1. Acute bilateral sacral insufficiency fractures. 2. No other acute abnormality within the lumbar spine. No evidence for cord compression. 3. 2 mm anterolisthesis of L4 on L5 with associated severe bilateral facet arthropathy,  resulting in moderate canal stenosis. 4. Additional more mild multilevel degenerative spondylolysis as above. No other significant canal or foraminal narrowing within the lumbar spine. Electronically Signed   By: Jeannine Boga M.D.   On: 05/16/2016 19:51   Ct Abdomen Pelvis W Contrast  Addendum Date: 05/16/2016   ADDENDUM REPORT: 05/16/2016 19:41 ADDENDUM: Mildly ectatic ascending thoracic aorta. Recommend annual imaging followup by CTA or MRA. This recommendation follows 2010 ACCF/AHA/AATS/ACR/ASA/SCA/SCAI/SIR/STS/SVM Guidelines for the Diagnosis and Management of Patients with Thoracic Aortic Disease. Circulation. 2010; 121: Y301-S010 Electronically Signed   By: Logan Bores M.D.   On: 05/16/2016 19:41   Result Date: 05/16/2016 CLINICAL DATA:  80 pound weight loss over 1 year. Bilateral hip pain. EXAM: CT CHEST, ABDOMEN, AND PELVIS WITH CONTRAST TECHNIQUE: Multidetector CT imaging of the chest, abdomen and pelvis was performed following the standard protocol during bolus administration of intravenous contrast. CONTRAST:  39m ISOVUE-300 IOPAMIDOL (ISOVUE-300) INJECTION 61% COMPARISON:  None. FINDINGS: CT CHEST FINDINGS Cardiovascular: Mild thoracic aortic atherosclerosis. Ascending thoracic aorta measures up to 4.0 cm in diameter. Three-vessel coronary artery atherosclerosis. Heart size is within normal limits. There is a small pericardial effusion. Mediastinum/Nodes: Diffusely heterogeneous thyroid with small  nodules measuring up to 1.3 cm in size. No enlarged axillary, mediastinal, or hilar lymph nodes are identified. Lungs/Pleura: No pleural effusion. Major airways are patent. Partially calcified right upper lobe nodule measures 6 mm (series 205, image 30). A 2 mm calcified nodule is noted in the left lower lobe (series 25, image 76). There is mild respiratory motion artifact with mild dependent atelectasis in the lower lobes. Musculoskeletal: No chest wall mass or suspicious bone lesions identified. Thoracic spondylosis. Slight depression of the T4 and T7 superior endplates with subjacent osseous sclerosis. CT ABDOMEN PELVIS FINDINGS Hepatobiliary: Mildly decreased attenuation of the liver diffusely suggestive of steatosis, with likely small areas of more focal fatty deposition at the gallbladder fossa. Mildly distended gallbladder without evidence of wall thickening, pericholecystic inflammatory change, or biliary dilatation. Pancreas: 1.9 x 1.2 cm low-density/cystic lesion in the pancreatic head. No pancreatic ductal dilatation or atrophy. No peripancreatic inflammatory change. Spleen: Unremarkable. Adrenals/Urinary Tract: Adrenal glands are unremarkable. 1.4 cm right upper pole renal cyst. No hydronephrosis or renal calculi. Unremarkable bladder. Stomach/Bowel: Mild gaseous distention of the stomach without evidence of wall thickening. Oral contrast is present in multiple nondilated loops of small bowel. There is no evidence of bowel obstruction or inflammatory process. A moderate amount of stool is present throughout the colon. The appendix is unremarkable. Vascular/Lymphatic: Abdominal aortic atherosclerosis without aneurysm. No enlarged lymph nodes are identified. Reproductive: Status post hysterectomy. No adnexal masses. Other: No intraperitoneal free fluid.  No abdominal wall hernia. Musculoskeletal: Old posterior left twelfth rib fracture. Old displaced right L1 transverse process fracture. Deformity of the  left L2 transverse process consistent with old fracture. Mildly angulated left L3 transverse process fracture and slightly distracted right L4 transverse process fracture, both of indeterminate age. Old left L4 transverse process fracture. Likely bilateral L5 transverse process fractures of indeterminate age. L3 superior endplate Schmorl's node deformity. Grade 1 anterolisthesis of L4 on L5 and L5 on S1, facet mediated. Bilateral L5-S1 facet ankylosis. Bilateral sacral insufficiency type fractures of indeterminate acuity though potentially recent/ unhealed. 1.5 cm densely sclerotic focus in the S3 segment, nonspecific but may represent a bone island. IMPRESSION: 1. No evidence of malignancy in the chest. 2. 1.9 cm cystic pancreatic head lesion, indeterminate. Consider follow-up contrast-enhanced abdominal MRI or pancreatic protocol CT in 2  years to assess stability. 3. Multiple transverse process fractures in the lumbar spine, many of which are chronic although some are of indeterminate age. 4. Bilateral sacral insufficiency fractures. 5. Mild T4 and T7 superior endplate fracture deformities of indeterminate age. 6. Small pericardial effusion. 7. Hepatic steatosis. 8. Aortic atherosclerosis. Electronically Signed: By: Logan Bores M.D. On: 05/16/2016 14:40   Dg Hips Bilat W Or Wo Pelvis 3-4 Views  Result Date: 05/16/2016 CLINICAL DATA:  Bilateral hip, lower lumbosacral and sacral pain for 1 week. Progressive pain. No known injury. EXAM: DG HIP (WITH OR WITHOUT PELVIS) 3-4V BILAT COMPARISON:  No prior hyper pelvis radiograph. FINDINGS: The cortical margins of the bony pelvis and both hips are intact. No fracture. Pubic symphysis and sacroiliac joints are congruent. Both femoral heads are well-seated in the respective acetabula. Scattered sclerotic densities over the sacrum both iliac bones are nonspecific, favored benign bone islands. IMPRESSION: No acute bony abnormality to explain bilateral hip pain.  Electronically Signed   By: Jeb Levering M.D.   On: 05/16/2016 02:37    Micro Results     Recent Results (from the past 240 hour(s))  Culture, Urine     Status: Abnormal   Collection Time: 05/16/16  2:08 AM  Result Value Ref Range Status   Specimen Description URINE, RANDOM  Final   Special Requests NONE  Final   Culture MULTIPLE SPECIES PRESENT, SUGGEST RECOLLECTION (A)  Final   Report Status 05/17/2016 FINAL  Final  Blood culture (routine x 2)     Status: None   Collection Time: 05/16/16  3:24 AM  Result Value Ref Range Status   Specimen Description BLOOD LEFT ARM  Final   Special Requests IN PEDIATRIC BOTTLE 4ML  Final   Culture NO GROWTH 5 DAYS  Final   Report Status 05/21/2016 FINAL  Final  Blood culture (routine x 2)     Status: None (Preliminary result)   Collection Time: 05/16/16  3:25 AM  Result Value Ref Range Status   Specimen Description BLOOD RIGHT FOREARM  Final   Special Requests BOTTLES DRAWN AEROBIC AND ANAEROBIC 5ML  Final   Culture  Setup Time   Final    GRAM POSITIVE RODS ANAEROBIC BOTTLE ONLY CRITICAL RESULT CALLED TO, READ BACK BY AND VERIFIED WITH: M [facility] AT 5643 05/20/16 BY L BENFIELD    Culture   Final    GRAM POSITIVE RODS CULTURE REINCUBATED FOR BETTER GROWTH    Report Status PENDING  Incomplete  Blood Culture ID Panel (Reflexed)     Status: None   Collection Time: 05/16/16  3:25 AM  Result Value Ref Range Status   Enterococcus species NOT DETECTED NOT DETECTED Final   Listeria monocytogenes NOT DETECTED NOT DETECTED Final   Staphylococcus species NOT DETECTED NOT DETECTED Final   Staphylococcus aureus NOT DETECTED NOT DETECTED Final   Streptococcus species NOT DETECTED NOT DETECTED Final   Streptococcus agalactiae NOT DETECTED NOT DETECTED Final   Streptococcus pneumoniae NOT DETECTED NOT DETECTED Final   Streptococcus pyogenes NOT DETECTED NOT DETECTED Final   Acinetobacter baumannii NOT DETECTED NOT DETECTED Final    Enterobacteriaceae species NOT DETECTED NOT DETECTED Final   Enterobacter cloacae complex NOT DETECTED NOT DETECTED Final   Escherichia coli NOT DETECTED NOT DETECTED Final   Klebsiella oxytoca NOT DETECTED NOT DETECTED Final   Klebsiella pneumoniae NOT DETECTED NOT DETECTED Final   Proteus species NOT DETECTED NOT DETECTED Final   Serratia marcescens NOT DETECTED NOT DETECTED Final  Haemophilus influenzae NOT DETECTED NOT DETECTED Final   Neisseria meningitidis NOT DETECTED NOT DETECTED Final   Pseudomonas aeruginosa NOT DETECTED NOT DETECTED Final   Candida albicans NOT DETECTED NOT DETECTED Final   Candida glabrata NOT DETECTED NOT DETECTED Final   Candida krusei NOT DETECTED NOT DETECTED Final   Candida parapsilosis NOT DETECTED NOT DETECTED Final   Candida tropicalis NOT DETECTED NOT DETECTED Final       Today   Subjective:   Ellen Payne today has no headache,no chest or abdominal pain,no new weakness tingling or numbness, feels much better  today.   Objective:   Blood pressure (!) 164/68, pulse (!) 104, temperature 98.1 F (36.7 C), temperature source Oral, resp. rate 18, height 5' 6" (1.676 m), weight 58.3 kg (128 lb 8 oz), SpO2 97 %.   Intake/Output Summary (Last 24 hours) at 05/22/16 1228 Last data filed at 05/22/16 1214  Gross per 24 hour  Intake              957 ml  Output             1750 ml  Net             -793 ml    Exam General exam: Pleasant female lying on bed, not in distress. Respiratory system: Clear to auscultation bilaterally, no wheezing or crackles appreciated. Cardiovascular system: Regular rate and rhythm, S1-S2 normal. Gastrointestinal system: Abdomen soft, nontender, bowel sounds positive. Central nervous system: Alert, awake, oriented. No focal neurological deficit.Marland Kitchen Extremities: 5/5 in all extremities. Skin: No rashes, lesions or ulcers  Data Review   CBC w Diff: Lab Results  Component Value Date   WBC 8.9 05/19/2016   HGB 11.7  (L) 05/19/2016   HCT 35.9 (L) 05/19/2016   HCT 30.0 (L) 05/16/2016   PLT 538 (H) 05/19/2016   LYMPHOPCT 7 05/16/2016   MONOPCT 8 05/16/2016   EOSPCT 0 05/16/2016   BASOPCT 0 05/16/2016    CMP: Lab Results  Component Value Date   NA 127 (L) 05/22/2016   K 4.8 05/22/2016   CL 94 (L) 05/22/2016   CO2 24 05/22/2016   BUN 22 (H) 05/22/2016   CREATININE 0.65 05/22/2016   PROT 5.9 (L) 05/16/2016   ALBUMIN 3.1 (L) 05/16/2016   BILITOT 0.8 05/16/2016   ALKPHOS 154 (H) 05/16/2016   AST 36 05/16/2016   ALT 17 05/16/2016  .   Total Time in preparing paper work, data evaluation and todays exam - 35 minutes  Alayja Armas M.D on 05/22/2016 at 12:28 PM  [facility]   Office  [phone]

## 2016-05-22 NOTE — Clinical Social Work Placement (Signed)
   CLINICAL SOCIAL WORK PLACEMENT  NOTE  Date:  05/22/2016  Patient Details  Name: Ellen FaceBarbara J Dibenedetto MRN: [id number] Date of Birth: 07/04/1936  Clinical Social Work is seeking post-discharge placement for this patient at the Skilled  Nursing Facility level of care (*CSW will initial, date and re-position this form in  chart as items are completed):  Yes   Patient/family provided with [place] Clinical Social Work Department's list of facilities offering this level of care within the geographic area requested by the patient (or if unable, by the patient's family).  Yes   Patient/family informed of their freedom to choose among providers that offer the needed level of care, that participate in Medicare, Medicaid or managed care program needed by the patient, have an available bed and are willing to accept the patient.  Yes   Patient/family informed of [facility] ownership interest [facility] and [facility], as well as of the fact that they are under no obligation to receive care at these facilities.  PASRR submitted to EDS on 05/18/16     PASRR number received on 05/18/16     Existing PASRR number confirmed on       FL2 transmitted to all facilities in geographic area requested by pt/family on 05/18/16     FL2 transmitted to all facilities within larger geographic area on       Patient informed that his/her managed care company has contracts with or will negotiate with certain facilities, including the following:        Yes   Patient/family informed of bed offers received.  Patient chooses bed at [facility] [place]     Physician recommends and patient chooses bed at      Patient to be transferred to [facility] [place] on 05/22/16.  Patient to be transferred to facility by [facility]     Patient family notified on 05/22/16 of transfer.  Name of family member notified:  Rosey Batheresa     PHYSICIAN       Additional Comment:     _______________________________________________ Margarito LinerSarah C Qais Jowers, LCSW 05/22/2016, 1:05 PM

## 2016-05-22 NOTE — Clinical Social Work Note (Addendum)
CSW facilitated patient discharge including contacting patient family and facility to confirm patient discharge plans. Clinical information faxed to facility and family agreeable with plan. CSW arranged ambulance transport via [facility] to [facility] [place] around 2:30 pm. RN to call report prior to discharge [phone] Room 704B).  CSW will sign off for now as social work intervention is no longer needed. Please consult us again if new needs arise.  Ellen CourtSarah Keili Payne, CSW [phone]

## 2016-05-22 NOTE — Progress Notes (Signed)
Physical Therapy Treatment Patient Details Name: DENYA BUCKINGHAM MRN: [id number] DOB: [birth year] Today's Date: 05/22/2016    History of Present Illness MIRAGE PFEFFERKORN is a 80 y.o. female with a past medical history significant for NIDDM, HTN who presents with back pain and leg weakness and fever.    PT Comments    Pt scheduled to d/c to SNF later today.  Worked on standing and balance with her.  Daughter had many questions regarding SNF and answered them and she was very Adult nurse.    Follow Up Recommendations  SNF;Supervision/Assistance - 24 hour     Equipment Recommendations  None recommended by PT    Recommendations for Other Services       Precautions / Restrictions Precautions Precautions: Fall Restrictions Weight Bearing Restrictions: No    Mobility  Bed Mobility Overal bed mobility: Needs Assistance Bed Mobility: Supine to Sit;Sit to Supine     Supine to sit: Min assist Sit to supine: Min assist   General bed mobility comments: MIN A for bed mobility.  Pt able to bridge for pad re-positioning  Transfers Overall transfer level: Needs assistance Equipment used:  (scale) Transfers: Sit to/from Stand Sit to Stand: Mod assist;+2 physical assistance         General transfer comment: On 1st stand, pt with extreme retropulsion with feet sliding in front of her. Pt unable to correct and get COG over BOS.  returned to sitting and then stood again with feet blocked intially.  Pt then able to stand to scale and get weight without having such extensive retropulsion.  Ambulation/Gait                 Stairs            Wheelchair Mobility    Modified Rankin (Stroke Patients Only)       Balance           Standing balance support: Bilateral upper extremity supported Standing balance-Leahy Scale: Poor                      Cognition Arousal/Alertness: Awake/alert Behavior During Therapy: Anxious Overall Cognitive Status: Within  Functional Limits for tasks assessed       Memory: Decreased short-term memory (family reports getting worse at night)              Exercises      General Comments General comments (skin integrity, edema, etc.): Daughter asking about rehab at [facility].  Answered all her questions to her satisfaction.      Pertinent Vitals/Pain Pain Assessment: No/denies pain    Home Living                      Prior Function            PT Goals (current goals can now be found in the care plan section) Acute Rehab PT Goals PT Goal Formulation: With patient/family Time For Goal Achievement: 05/24/16 Potential to Achieve Goals: Good Progress towards PT goals: Progressing toward goals    Frequency    Min 3X/week      PT Plan Current plan remains appropriate    Co-evaluation             End of Session Equipment Utilized During Treatment: Gait belt Activity Tolerance: Patient limited by fatigue Patient left: in bed;with call bell/phone within reach;with bed alarm set;with family/visitor present     Time: 1112-1126 PT Time Calculation (min) (ACUTE ONLY): 14  min  Charges:  $Therapeutic Activity: 8-22 mins                    G Codes:      Cierah Crader LUBECK 05/22/2016, 12:46 PM

## 2016-05-22 NOTE — Progress Notes (Addendum)
Pt has orders to be discharged to [facility]. Discharge instructions given and pt has no additional questions at this time. Medication regimen reviewed and pt educated. Pt verbalized understanding and has no additional questions. Telemetry box removed. IV removed and site in good condition. Pt stable and waiting for transportation. Report attempted to [facility], remained on hold for 6 min.   Jilda PandaBethany Electra Paladino RN

## 2016-05-23 LAB — CULTURE, BLOOD (ROUTINE X 2)

## 2016-08-22 ENCOUNTER — Encounter (HOSPITAL_COMMUNITY): Payer: Self-pay

## 2016-08-22 ENCOUNTER — Emergency Department (HOSPITAL_COMMUNITY)
Admission: EM | Admit: 2016-08-22 | Discharge: 2016-08-22 | Disposition: A | Discharge status: Home / Self Care | Payer: Medicare Other | Attending: Emergency Medicine | Admitting: Emergency Medicine

## 2016-08-22 ENCOUNTER — Emergency Department (HOSPITAL_COMMUNITY): Payer: Medicare Other

## 2016-08-22 DIAGNOSIS — E119 Type 2 diabetes mellitus without complications: Secondary | ICD-10-CM | POA: Diagnosis not present

## 2016-08-22 DIAGNOSIS — Z7982 Long term (current) use of aspirin: Secondary | ICD-10-CM | POA: Diagnosis not present

## 2016-08-22 DIAGNOSIS — R4182 Altered mental status, unspecified: Secondary | ICD-10-CM | POA: Diagnosis present

## 2016-08-22 DIAGNOSIS — Z79899 Other long term (current) drug therapy: Secondary | ICD-10-CM | POA: Diagnosis not present

## 2016-08-22 DIAGNOSIS — I10 Essential (primary) hypertension: Secondary | ICD-10-CM | POA: Diagnosis not present

## 2016-08-22 DIAGNOSIS — R404 Transient alteration of awareness: Secondary | ICD-10-CM

## 2016-08-22 DIAGNOSIS — Z7984 Long term (current) use of oral hypoglycemic drugs: Secondary | ICD-10-CM | POA: Insufficient documentation

## 2016-08-22 DIAGNOSIS — N390 Urinary tract infection, site not specified: Secondary | ICD-10-CM | POA: Insufficient documentation

## 2016-08-22 DIAGNOSIS — G459 Transient cerebral ischemic attack, unspecified: Secondary | ICD-10-CM | POA: Diagnosis not present

## 2016-08-22 LAB — I-STAT CHEM 8, ED
BUN: 14 mg/dL (ref 6–20)
CHLORIDE: 97 mmol/L — AB (ref 101–111)
Calcium, Ion: 1.23 mmol/L (ref 1.15–1.40)
Creatinine, Ser: 0.6 mg/dL (ref 0.44–1.00)
Glucose, Bld: 125 mg/dL — ABNORMAL HIGH (ref 65–99)
HEMATOCRIT: 36 % (ref 36.0–46.0)
HEMOGLOBIN: 12.2 g/dL (ref 12.0–15.0)
POTASSIUM: 3.5 mmol/L (ref 3.5–5.1)
SODIUM: 138 mmol/L (ref 135–145)
TCO2: 28 mmol/L (ref 0–100)

## 2016-08-22 LAB — COMPREHENSIVE METABOLIC PANEL
ALT: 14 U/L (ref 14–54)
AST: 27 U/L (ref 15–41)
Albumin: 3.6 g/dL (ref 3.5–5.0)
Alkaline Phosphatase: 73 U/L (ref 38–126)
Anion gap: 13 (ref 5–15)
BUN: 12 mg/dL (ref 6–20)
CHLORIDE: 99 mmol/L — AB (ref 101–111)
CO2: 26 mmol/L (ref 22–32)
Calcium: 9.9 mg/dL (ref 8.9–10.3)
Creatinine, Ser: 0.69 mg/dL (ref 0.44–1.00)
Glucose, Bld: 128 mg/dL — ABNORMAL HIGH (ref 65–99)
POTASSIUM: 3.6 mmol/L (ref 3.5–5.1)
SODIUM: 138 mmol/L (ref 135–145)
Total Bilirubin: 0.8 mg/dL (ref 0.3–1.2)
Total Protein: 6.7 g/dL (ref 6.5–8.1)

## 2016-08-22 LAB — URINALYSIS, ROUTINE W REFLEX MICROSCOPIC
BILIRUBIN URINE: NEGATIVE
GLUCOSE, UA: NEGATIVE mg/dL
HGB URINE DIPSTICK: NEGATIVE
KETONES UR: NEGATIVE mg/dL
Nitrite: POSITIVE — AB
PH: 7 (ref 5.0–8.0)
Protein, ur: 30 mg/dL — AB
Specific Gravity, Urine: 1.012 (ref 1.005–1.030)

## 2016-08-22 LAB — DIFFERENTIAL
BASOS ABS: 0 10*3/uL (ref 0.0–0.1)
BASOS PCT: 0 %
EOS ABS: 0 10*3/uL (ref 0.0–0.7)
Eosinophils Relative: 1 %
Lymphocytes Relative: 17 %
Lymphs Abs: 1.1 10*3/uL (ref 0.7–4.0)
MONO ABS: 0.4 10*3/uL (ref 0.1–1.0)
MONOS PCT: 6 %
NEUTROS ABS: 5.1 10*3/uL (ref 1.7–7.7)
Neutrophils Relative %: 76 %

## 2016-08-22 LAB — CBC
HEMATOCRIT: 35.4 % — AB (ref 36.0–46.0)
HEMOGLOBIN: 11.3 g/dL — AB (ref 12.0–15.0)
MCH: 26.5 pg (ref 26.0–34.0)
MCHC: 31.9 g/dL (ref 30.0–36.0)
MCV: 83.1 fL (ref 78.0–100.0)
PLATELETS: 419 10*3/uL — AB (ref 150–400)
RBC: 4.26 MIL/uL (ref 3.87–5.11)
RDW: 15 % (ref 11.5–15.5)
WBC: 6.6 10*3/uL (ref 4.0–10.5)

## 2016-08-22 LAB — I-STAT TROPONIN, ED: TROPONIN I, POC: 0 ng/mL (ref 0.00–0.08)

## 2016-08-22 LAB — PROTIME-INR
INR: 0.93
PROTHROMBIN TIME: 12.5 s (ref 11.4–15.2)

## 2016-08-22 LAB — APTT: aPTT: 31 seconds (ref 24–36)

## 2016-08-22 MED ORDER — CEPHALEXIN 250 MG PO CAPS
500.0000 mg | ORAL_CAPSULE | Freq: Once | ORAL | Status: AC
  Administered 2016-08-22: 500 mg via ORAL
  Filled 2016-08-22: qty 2

## 2016-08-22 MED ORDER — CEPHALEXIN 500 MG PO CAPS: 500.0000 mg | ORAL_CAPSULE | Freq: Three times a day (TID) | ORAL | 0 refills | Status: AC

## 2016-08-22 NOTE — ED Triage Notes (Signed)
Per Pt, Pt is coming from home with granddaughter. Granddaughter reports pt was at lunch when she started to have right arm weakness, facial droop, confusion, and slurred speech. Episode lasted for about fifteen minutes. Pt's family reported it resolved.

## 2016-08-22 NOTE — ED Notes (Signed)
Patient placed on bedpan.

## 2016-08-22 NOTE — ED Provider Notes (Signed)
[facility]-EMERGENCY DEPT Provider Note   CSN: [id number] Arrival date & time: 08/22/16  1431     History   Chief Complaint Chief Complaint  Patient presents with  . Altered Mental Status  . Transient Ischemic Attack    HPI Ellen Payne is a 80 y.o. female.  The history is provided by the patient and a relative.  Altered Mental Status   This is a new problem. The current episode started 1 to 2 hours ago. The problem has been resolved. Associated symptoms include confusion, somnolence and weakness (right worse than left).    Past Medical History:  Diagnosis Date  . Anemia 05/2016  . Cataract   . Diabetes mellitus without complication (HCC)   . Hyperlipidemia   . Hypertension   . Hyponatremia 05/2016  . Osteoarthritis   . Osteopenia     Patient Active Problem List   Diagnosis Date Noted  . Malnutrition of moderate degree 05/19/2016  . Encephalopathy, metabolic   . Lower back pain   . Prolonged QT interval 05/16/2016  . Hypomagnesemia 05/16/2016  . Hypokalemia 05/16/2016  . Hyponatremia 05/16/2016  . Acute hyponatremia 05/16/2016  . Sepsis (HCC) 05/16/2016  . UTI (lower urinary tract infection) 05/16/2016  . Anemia, unspecified 05/16/2016  . Type 2 diabetes mellitus without complication, without long-term current use of insulin (HCC) 05/16/2016  . Essential hypertension 05/16/2016    Past Surgical History:  Procedure Laterality Date  . ABDOMINAL HYSTERECTOMY      OB History    No data available       Home Medications    Prior to Admission medications   Medication Sig Start Date End Date Taking? Authorizing Provider  acetaminophen (TYLENOL) 325 MG tablet Take 2 tablets (650 mg total) by mouth every 6 (six) hours as needed for mild pain (or Fever >/= 101). 05/22/16   Starleen Armsawood S Elgergawy, MD  aspirin EC 81 MG tablet Take 81 mg by mouth daily.    Historical Provider, MD  atorvastatin (LIPITOR) 40 MG tablet Take 40 mg by mouth at bedtime.    Historical  Provider, MD  Cholecalciferol (VITAMIN D3) 5000 units TABS Take 1 tablet by mouth daily.    Historical Provider, MD  cyanocobalamin (,VITAMIN B-12,) 1000 MCG/ML injection Please take once weekly every Monday for 4 weeks, then change to once monthly. 05/22/16   Leana Roeawood S Elgergawy, MD  feeding supplement, GLUCERNA SHAKE, (GLUCERNA SHAKE) LIQD Take 237 mLs by mouth 2 (two) times daily between meals. 05/23/16   Leana Roeawood S Elgergawy, MD  lisinopril (PRINIVIL,ZESTRIL) 20 MG tablet Take 1 tablet (20 mg total) by mouth daily. 05/23/16   Leana Roeawood S Elgergawy, MD  metFORMIN (GLUCOPHAGE) 500 MG tablet Take 1,000 mg by mouth 2 (two) times daily with a meal.     Historical Provider, MD  sodium chloride 1 g tablet Take 1 tablet (1 g total) by mouth 2 (two) times daily with a meal. 05/22/16   Starleen Armsawood S Elgergawy, MD    Family History Family History  Problem Relation Age of Onset  . Hypertension Mother   . Lung cancer Father   . Lung cancer Brother   . Heart attack Son     Social History Social History  Substance Use Topics  . Smoking status: Never Smoker  . Smokeless tobacco: Never Used  . Alcohol use No     Allergies   Patient has no known allergies.   Review of Systems Review of Systems  Neurological: Positive for weakness (right worse  than left).  Psychiatric/Behavioral: Positive for confusion.  All other systems reviewed and are negative.    Physical Exam Updated Vital Signs BP 182/81 (BP Location: Right Arm)   Pulse 106   Temp 97.6 F (36.4 C) (Oral)   Resp 22   Ht 5\' 6"  (1.676 m)   Wt 129 lb (58.5 kg)   SpO2 98%   BMI 20.82 kg/m   Physical Exam  Constitutional: She is oriented to person, place, and time. She appears well-developed and well-nourished. No distress.  HENT:  Head: Normocephalic.  Nose: Nose normal.  Eyes: Conjunctivae are normal.  Neck: Neck supple. No tracheal deviation present.  Cardiovascular: Normal rate, regular rhythm and normal heart sounds.     Pulmonary/Chest: Effort normal and breath sounds normal. No respiratory distress.  Abdominal: Soft. She exhibits no distension. There is no tenderness. There is no guarding.  Neurological: She is alert and oriented to person, place, and time. No cranial nerve deficit or sensory deficit. Coordination normal.  Skin: Skin is warm and dry.  Psychiatric: She has a normal mood and affect.     ED Treatments / Results  Labs (all labs ordered are listed, but only abnormal results are displayed) Labs Reviewed  CBC - Abnormal; Notable for the following:       Result Value   Hemoglobin 11.3 (*)    HCT 35.4 (*)    Platelets 419 (*)    All other components within normal limits  COMPREHENSIVE METABOLIC PANEL - Abnormal; Notable for the following:    Chloride 99 (*)    Glucose, Bld 128 (*)    All other components within normal limits  URINALYSIS, ROUTINE W REFLEX MICROSCOPIC - Abnormal; Notable for the following:    APPearance HAZY (*)    Protein, ur 30 (*)    Nitrite POSITIVE (*)    Leukocytes, UA MODERATE (*)    Bacteria, UA MANY (*)    Squamous Epithelial / LPF 0-5 (*)    All other components within normal limits  I-STAT CHEM 8, ED - Abnormal; Notable for the following:    Chloride 97 (*)    Glucose, Bld 125 (*)    All other components within normal limits  URINE CULTURE  PROTIME-INR  APTT  DIFFERENTIAL  I-STAT TROPOININ, ED    EKG  EKG Interpretation  Date/Time:  Tuesday August 22 2016 14:48:43 EST Ventricular Rate:  104 PR Interval:  164 QRS Duration: 74 QT Interval:  340 QTC Calculation: 447 R Axis:   25 Text Interpretation:  Sinus tachycardia Anteroseptal infarct , age undetermined Abnormal ECG No significant change since last tracing Confirmed by Isak Sotomayor MD, Adaline Trejos [phone]) on 08/22/2016 5:04:23 PM       Radiology Ct Head Wo Contrast  Result Date: 08/22/2016 CLINICAL DATA:  Slurred speech, facial droop. EXAM: CT HEAD WITHOUT CONTRAST TECHNIQUE: Contiguous axial  images were obtained from the base of the skull through the vertex without intravenous contrast. COMPARISON:  None. FINDINGS: Brain: Mild chronic ischemic white matter disease is noted. No mass effect or midline shift is noted. Ventricular size is within normal limits. There is no evidence of mass lesion, hemorrhage or acute infarction. Vascular: Atherosclerosis of carotid siphons is noted. Skull: Normal. Negative for fracture or focal lesion. Sinuses/Orbits: Mild right sphenoid sinusitis is noted. Other: None. IMPRESSION: Mild chronic ischemic white matter disease. No acute intracranial abnormality seen. Electronically Signed   By: Lupita Raider, M.D.   On: 08/22/2016 15:34   Mr Brain Ilda Basset  Contrast  Result Date: 08/22/2016 CLINICAL DATA:  80 year old female with episode of right upper extremity weakness facial droop confusion and slurred speech beginning at lunch. Initial encounter. EXAM: MRI HEAD WITHOUT CONTRAST TECHNIQUE: Multiplanar, multiecho pulse sequences of the brain and surrounding structures were obtained without intravenous contrast. COMPARISON:  Head CT without contrast 1520 hours today. FINDINGS: Brain: No restricted diffusion to suggest acute infarction. No midline shift, mass effect, evidence of mass lesion, ventriculomegaly, extra-axial collection or acute intracranial hemorrhage. Pituitary within normal limits. Largely unremarkable for age gray and white matter signal throughout the brain. No cortical encephalomalacia or chronic cerebral blood products identified. Deep gray matter nuclei, brainstem, and cerebellum appear normal. Vascular: Major intracranial vascular flow voids are preserved, the distal left vertebral artery appears dominant. Skull and upper cervical spine: Degenerative ligamentous hypertrophy about the odontoid resulting in mild spinal stenosis at the cervicomedullary junction (series 5, image 12 and series 6, image 1). No signal abnormality in the brainstem or visible spinal  cord. Other cervical disc and endplate degeneration is noted. Normal bone marrow signal. Sinuses/Orbits: Normal orbits soft tissues. Mild sphenoid sinus mucosal thickening. Other: Mild bilateral mastoid effusions. Negative nasopharynx. Visible internal auditory structures appear grossly normal. Negative scalp soft tissues. IMPRESSION: 1. No acute intracranial abnormality and largely unremarkable for age noncontrast MRI appearance of the brain. 2. Degenerative spinal stenosis at the cervicomedullary junction, primarily due to ligamentous hypertrophy about the odontoid. 3. Mild sphenoid sinus inflammation. Mild bilateral mastoid effusions, most often postinflammatory. Electronically Signed   By: Odessa FlemingH  Hall M.D.   On: 08/22/2016 18:58    Procedures Procedures (including critical care time)  Medications Ordered in ED Medications  cephALEXin (KEFLEX) capsule 500 mg (500 mg Oral Given 08/22/16 1906)     Initial Impression / Assessment and Plan / ED Course  I have reviewed the triage vital signs and the nursing notes.  Pertinent labs & imaging results that were available during my care of the patient were reviewed by me and considered in my medical decision making (see chart for details).  Clinical Course     80 y.o. female presents with transient alteration in consciousness while with a family member earlier today. She is completely resolved here but had a 10-15 minute episode with weakness and confusion. CT negative for ICH, due to nature of her symptoms and advanced age MR was performed to evaluate for ischemic etiology. No ischemia was found. Pt with pyuria and had episode of encephalopathy with prior UTI. I suspect this is contributing to her symptoms today. Will treat empirically with keflex pending culture. Her symptoms are currently completely resolved. Plan to follow up with PCP as needed and return precautions discussed for worsening or new concerning symptoms.   Final Clinical Impressions(s) /  ED Diagnoses   Final diagnoses:  Urinary tract infection without hematuria, site unspecified  Transient alteration of awareness    New Prescriptions Discharge Medication List as of 08/22/2016  8:12 PM    START taking these medications   Details  cephALEXin (KEFLEX) 500 MG capsule Take 1 capsule (500 mg total) by mouth 3 (three) times daily., Starting Wed 08/23/2016, Until Sat 09/02/2016, Print         Lyndal Pulleyaniel Rosezella Kronick, MD 08/23/16 [phone]

## 2016-08-24 LAB — URINE CULTURE

## 2017-01-31 ENCOUNTER — Ambulatory Visit (INDEPENDENT_AMBULATORY_CARE_PROVIDER_SITE_OTHER): Payer: Medicare Other | Admitting: Podiatry

## 2017-01-31 ENCOUNTER — Encounter: Payer: Self-pay | Admitting: Podiatry

## 2017-01-31 VITALS — BP 120/74 | HR 82 | Resp 18

## 2017-01-31 DIAGNOSIS — E1151 Type 2 diabetes mellitus with diabetic peripheral angiopathy without gangrene: Secondary | ICD-10-CM | POA: Diagnosis not present

## 2017-01-31 DIAGNOSIS — B351 Tinea unguium: Secondary | ICD-10-CM

## 2017-01-31 DIAGNOSIS — E1142 Type 2 diabetes mellitus with diabetic polyneuropathy: Secondary | ICD-10-CM | POA: Diagnosis not present

## 2017-01-31 NOTE — Progress Notes (Signed)
   Subjective:    Patient ID: Ellen Payne, female    DOB: 05/05/1936, 81 y.o.   MRN: [id number]  HPI     This patient presents today complaining that her toenails are thick and discolored and states that she is unable to trim your toenails and requests nail debridement. Patient has attempted self treatment, however, discontinue self treatment cause of her diabetic condition. Also, patient's granddaughter has trim the nails in the past and now request professional treatment  Patient is diabetic denies history of foot ulceration, claudication or amputation  Patient denies smoking history Patient's granddaughter present to treatment today  Review of Systems  Constitutional: Positive for activity change and appetite change.  Cardiovascular: Positive for leg swelling.  Endocrine:       Increase urination  Genitourinary: Positive for frequency and urgency.  Musculoskeletal: Positive for gait problem.  Neurological: Positive for headaches.  Hematological: Bruises/bleeds easily.  Psychiatric/Behavioral: Positive for confusion.       Objective:   Physical Exam  Orientated 3  Vascular: Bilateral peripheral pitting edema DP pulses 2/4 bilaterally PT pulse 1/4 bilaterally Capillary reflex within normal limits bilaterally  Neurological Sensation to 10 g monofilament wire intact 5/5 bilaterally Vibratory sensation nonreactive bilaterally Ankle reflex reactive bilaterally  Dermatological: No open skin lesions bilaterally Atrophic skin with absent hair growth bilaterally The toenails 6-10 are elongated, incurvated, discolored with texture and color changes  Musculoskeletal: Prominent dorsal base first metatarsal cuneiform right Pes planus bilaterally Hammertoe 2-5 bilaterally Manual motor testing dorsi flexion, plantar flexion 5/5 bilaterally      Assessment & Plan:   Assessment: Diabetic peripheral neuropathy Diabetic peripheral arterial disease Mycotic toenails  6-10  Plan: Today I reviewed the results exam with patient and patient's granddaughter. At this time I made him aware there was slightly decreased feeling and circulation the feet without history of open wounds. Will continue to monitor. I recommended debridement of mycotic toenails and patient verbally consents  The toenails 6-10 were debrided mechanically and electrically without any bleeding  Reappoint 3 months

## 2017-01-31 NOTE — Patient Instructions (Signed)

## 2017-04-11 ENCOUNTER — Ambulatory Visit (INDEPENDENT_AMBULATORY_CARE_PROVIDER_SITE_OTHER): Payer: Medicare Other | Admitting: Emergency Medicine

## 2017-04-11 ENCOUNTER — Encounter: Payer: Self-pay | Admitting: Emergency Medicine

## 2017-04-11 VITALS — BP 150/64 | HR 85 | Temp 98.0°F | Resp 16 | Ht 64.0 in | Wt 138.0 lb

## 2017-04-11 DIAGNOSIS — M7150 Other bursitis, not elsewhere classified, unspecified site: Secondary | ICD-10-CM | POA: Insufficient documentation

## 2017-04-11 DIAGNOSIS — M25422 Effusion, left elbow: Secondary | ICD-10-CM | POA: Insufficient documentation

## 2017-04-11 MED ORDER — CEFADROXIL 500 MG PO CAPS: 500.0000 mg | ORAL_CAPSULE | Freq: Two times a day (BID) | ORAL | 0 refills | Status: AC

## 2017-04-11 NOTE — Progress Notes (Signed)
Ellen Payne 81 y.o.   Chief Complaint  Patient presents with  . Cyst    LEFT elbow x 2 weeks    HISTORY OF PRESENT ILLNESS: This is a 81 y.o. female complaining of left elbow swelling following minor trauma 2 weeks ago. Denies pain and has FROM. Diabetic.  HPI   Prior to Admission medications   Medication Sig Start Date End Date Taking? Authorizing Provider  acetaminophen (TYLENOL) 325 MG tablet Take 2 tablets (650 mg total) by mouth every 6 (six) hours as needed for mild pain (or Fever >/= 101). 05/22/16  Yes Elgergawy, Leana Roe, MD  aspirin EC 81 MG tablet Take 81 mg by mouth daily.   Yes [provider]  atorvastatin (LIPITOR) 40 MG tablet Take 40 mg by mouth at bedtime.   Yes [provider]  Cholecalciferol (VITAMIN D3) 5000 units TABS Take 1 tablet by mouth daily.   Yes [provider]  feeding supplement, GLUCERNA SHAKE, (GLUCERNA SHAKE) LIQD Take 237 mLs by mouth 2 (two) times daily between meals. 05/23/16  Yes Elgergawy, Leana Roe, MD  FERREX 150 150 MG capsule Take 150 mg by mouth daily. 01/01/17  Yes [provider]  lisinopril (PRINIVIL,ZESTRIL) 20 MG tablet Take 1 tablet (20 mg total) by mouth daily. 05/23/16  Yes Elgergawy, Leana Roe, MD  metFORMIN (GLUCOPHAGE) 500 MG tablet Take 1,000 mg by mouth 2 (two) times daily with a meal.    Yes [provider]  OVER THE COUNTER MEDICATION daily.   Yes [provider]  QUEtiapine (SEROQUEL) 25 MG tablet Take 25 mg by mouth 2 (two) times daily.   Yes [provider]  sodium chloride 1 g tablet Take 1 tablet (1 g total) by mouth 2 (two) times daily with a meal. 05/22/16  Yes Elgergawy, Leana Roe, MD  cefadroxil (DURICEF) 500 MG capsule Take 1 capsule (500 mg total) by mouth 2 (two) times daily. 04/11/17 04/18/17  Georgina Quint, MD  cyanocobalamin (,VITAMIN B-12,) 1000 MCG/ML injection Please take once weekly every Monday for 4 weeks, then change to once  monthly. Patient not taking: Reported on 04/11/2017 05/22/16   Elgergawy, Leana Roe, MD    No Known Allergies  Patient Active Problem List   Diagnosis Date Noted  . Malnutrition of moderate degree 05/19/2016  . Encephalopathy, metabolic   . Lower back pain   . Prolonged QT interval 05/16/2016  . Hypomagnesemia 05/16/2016  . Hypokalemia 05/16/2016  . Hyponatremia 05/16/2016  . Acute hyponatremia 05/16/2016  . Sepsis (HCC) 05/16/2016  . UTI (lower urinary tract infection) 05/16/2016  . Anemia, unspecified 05/16/2016  . Type 2 diabetes mellitus without complication, without long-term current use of insulin (HCC) 05/16/2016  . Essential hypertension 05/16/2016    Past Medical History:  Diagnosis Date  . Anemia 05/2016  . Cataract   . Diabetes mellitus without complication (HCC)   . Hyperlipidemia   . Hypertension   . Hyponatremia 05/2016  . Osteoarthritis   . Osteopenia     Past Surgical History:  Procedure Laterality Date  . ABDOMINAL HYSTERECTOMY      Social History   Social History  . Marital status: Married    Spouse name: N/A  . Number of children: N/A  . Years of education: N/A   Occupational History  . Not on file.   Social History Main Topics  . Smoking status: Never Smoker  . Smokeless tobacco: Never Used  . Alcohol use No  . Drug use:  No  . Sexual activity: Not on file   Other Topics Concern  . Not on file   Social History Narrative  . No narrative on file    Family History  Problem Relation Age of Onset  . Hypertension Mother   . Lung cancer Father   . Lung cancer Brother   . Heart attack Son      Review of Systems  Constitutional: Negative.  Negative for chills and fever.  Respiratory: Negative for shortness of breath.   Cardiovascular: Negative for chest pain and palpitations.  Gastrointestinal: Negative for nausea and vomiting.  Skin: Negative.  Negative for rash.  Neurological: Negative for dizziness and headaches.   Endo/Heme/Allergies: Negative.   All other systems reviewed and are negative.  Vitals:   04/11/17 1125 04/11/17 1210  BP: (!) 158/72 (!) 150/64  Pulse: 85   Resp: 16   Temp: 98 F (36.7 C)      Physical Exam  Constitutional: She is oriented to person, place, and time. She appears well-developed and well-nourished.  HENT:  Head: Normocephalic and atraumatic.  Eyes: Pupils are equal, round, and reactive to light. EOM are normal.  Neck: Normal range of motion. Neck supple.  Cardiovascular: Normal rate and regular rhythm.   Pulmonary/Chest: Effort normal and breath sounds normal.  Musculoskeletal:  Left elbow: FROM; +olecranon bursitis; no ecchymosis or erythema.  Neurological: She is alert and oriented to person, place, and time. No sensory deficit. She exhibits normal muscle tone.  Skin: Skin is warm and dry. Capillary refill takes less than 2 seconds. No rash noted.  Psychiatric: She has a normal mood and affect. Her behavior is normal.  Vitals reviewed.   After consent was obtained, using sterile technique the left elbow was prepped and plain Lidocaine 1% was used as local anesthetic. The bursa was entered and 20 ml's of bloody thin fluid was withdrawn. The procedure was well tolerated.  The patient is asked to continue to rest the joint for a few more days before resuming regular activities. Watch for fever, or increased swelling or persistent pain in the joint. Call or return to clinic prn if such symptoms occur or there is failure to improve as anticipated.   ASSESSMENT & PLAN: Ellen Payne was seen today for cyst.  Diagnoses and all orders for this visit:  Traumatic bursitis Comments: left elbow  Other orders -     cefadroxil (DURICEF) 500 MG capsule; Take 1 capsule (500 mg total) by mouth 2 (two) times daily.    Patient Instructions       IF you received an x-ray today, you will receive an invoice from [facility]. Please contact [facility] at  [phone] with questions or concerns regarding your invoice.   IF you received labwork today, you will receive an invoice from [facility]. Please contact [facility] at [phone] with questions or concerns regarding your invoice.   Our billing staff will not be able to assist you with questions regarding bills from these companies.  You will be contacted with the lab results as soon as they are available. The fastest way to get your results is to activate your My Chart account. Instructions are located on the last page of this paperwork. If you have not heard from [place] regarding the results in 2 weeks, please contact this office.     Elbow Bursitis A bursa is a fluid-filled sac that covers and protects a joint. Bursitis is when the fluid-filled sac gets puffy and sore (inflamed). Elbow bursitis,  also called olecranon bursitis, happens over your elbow. This may be caused by:  Injury (acute trauma) to your elbow.  Leaning on hard surfaces for long periods of time.  Infection from an injury that breaks the skin near your elbow.  A bone growth (spur) that forms at the tip of your elbow.  A medical condition that causes inflammation in your body, such as: ? Gout. ? Rheumatoid arthritis.  Sometimes the cause is not known. Follow these instructions at home:  Take medicines only as told by your doctor.  If you were prescribed an antibiotic medicine, finish all of it even if you start to feel better.  If your bursitis is caused by an injury, rest your elbow and wear your bandage as told by your doctor. You may also apply ice to the injured area as told by your doctor: ? Put ice in a plastic bag. ? Place a towel between your skin and the bag. ? Leave the ice on for 20 minutes, 2-3 times per day.  Do not do any activities that cause pain to your elbow.  Use elbow pads or wraps to cushion your elbow. Contact a doctor if:  You have a fever.  Your symptoms do not get better with  treatment.  Your pain or swelling gets worse.  Your pain or swelling goes away and then comes back.  You have drainage of pus from the swollen area over your elbow. This information is not intended to replace advice given to you by your health care provider. Make sure you discuss any questions you have with your health care provider. Document Released: 02/08/2010 Document Revised: 01/27/2016 Document Reviewed: 04/29/2014 Elsevier Interactive Patient Education  2018 [employer].      Edwina BarthMiguel Deke Tilghman, MD [facility]

## 2017-04-11 NOTE — Patient Instructions (Addendum)
     IF you received an x-ray today, you will receive an invoice from [facility]. Please contact  Radiology at [phone] with questions or concerns regarding your invoice.   IF you received labwork today, you will receive an invoice from [facility]. Please contact [facility] at [phone] with questions or concerns regarding your invoice.   Our billing staff will not be able to assist you with questions regarding bills from these companies.  You will be contacted with the lab results as soon as they are available. The fastest way to get your results is to activate your My Chart account. Instructions are located on the last page of this paperwork. If you have not heard from us regarding the results in 2 weeks, please contact this office.     Elbow Bursitis A bursa is a fluid-filled sac that covers and protects a joint. Bursitis is when the fluid-filled sac gets puffy and sore (inflamed). Elbow bursitis, also called olecranon bursitis, happens over your elbow. This may be caused by:  Injury (acute trauma) to your elbow.  Leaning on hard surfaces for long periods of time.  Infection from an injury that breaks the skin near your elbow.  A bone growth (spur) that forms at the tip of your elbow.  A medical condition that causes inflammation in your body, such as: ? Gout. ? Rheumatoid arthritis.  Sometimes the cause is not known. Follow these instructions at home:  Take medicines only as told by your doctor.  If you were prescribed an antibiotic medicine, finish all of it even if you start to feel better.  If your bursitis is caused by an injury, rest your elbow and wear your bandage as told by your doctor. You may also apply ice to the injured area as told by your doctor: ? Put ice in a plastic bag. ? Place a towel between your skin and the bag. ? Leave the ice on for 20 minutes, 2-3 times per day.  Do not do any activities that cause pain to your elbow.  Use  elbow pads or wraps to cushion your elbow. Contact a doctor if:  You have a fever.  Your symptoms do not get better with treatment.  Your pain or swelling gets worse.  Your pain or swelling goes away and then comes back.  You have drainage of pus from the swollen area over your elbow. This information is not intended to replace advice given to you by your health care provider. Make sure you discuss any questions you have with your health care provider. Document Released: 02/08/2010 Document Revised: 01/27/2016 Document Reviewed: 04/29/2014 Elsevier Interactive Patient Education  2018 [employer]vier Inc.  

## 2017-04-12 ENCOUNTER — Encounter: Payer: Self-pay | Admitting: Emergency Medicine

## 2017-04-12 ENCOUNTER — Ambulatory Visit (INDEPENDENT_AMBULATORY_CARE_PROVIDER_SITE_OTHER): Payer: Medicare Other | Admitting: Emergency Medicine

## 2017-04-12 VITALS — BP 170/99 | HR 83 | Temp 97.7°F | Resp 18 | Ht 64.0 in | Wt 138.0 lb

## 2017-04-12 DIAGNOSIS — R600 Localized edema: Secondary | ICD-10-CM | POA: Diagnosis not present

## 2017-04-12 DIAGNOSIS — M7022 Olecranon bursitis, left elbow: Secondary | ICD-10-CM | POA: Diagnosis not present

## 2017-04-12 NOTE — Progress Notes (Signed)
Ellen Payne 81 y.o.   Chief Complaint  Patient presents with  . Arm Swelling    patient returns to clinic with swollen L arm.    HISTORY OF PRESENT ILLNESS: This is a 81 y.o. female seen by me yesterday with left traumatic bursitis; 20 cc's of serosanguineous fluid drained. Today effusion returned and developed edema of left arm; started on antibiotic yesterday. No  HPI   Prior to Admission medications   Medication Sig Start Date End Date Taking? Authorizing Provider  acetaminophen (TYLENOL) 325 MG tablet Take 2 tablets (650 mg total) by mouth every 6 (six) hours as needed for mild pain (or Fever >/= 101). 05/22/16  Yes Elgergawy, Leana Roe, MD  aspirin EC 81 MG tablet Take 81 mg by mouth daily.   Yes [provider]  atorvastatin (LIPITOR) 40 MG tablet Take 40 mg by mouth at bedtime.   Yes [provider]  cefadroxil (DURICEF) 500 MG capsule Take 1 capsule (500 mg total) by mouth 2 (two) times daily. 04/11/17 04/18/17 Yes Ellen Provencal, Eilleen Kempf, MD  Cholecalciferol (VITAMIN D3) 5000 units TABS Take 1 tablet by mouth daily.   Yes [provider]  cyanocobalamin (,VITAMIN B-12,) 1000 MCG/ML injection Please take once weekly every Monday for 4 weeks, then change to once monthly. 05/22/16  Yes Elgergawy, Leana Roe, MD  feeding supplement, GLUCERNA SHAKE, (GLUCERNA SHAKE) LIQD Take 237 mLs by mouth 2 (two) times daily between meals. 05/23/16  Yes Elgergawy, Leana Roe, MD  FERREX 150 150 MG capsule Take 150 mg by mouth daily. 01/01/17  Yes [provider]  lisinopril (PRINIVIL,ZESTRIL) 20 MG tablet Take 1 tablet (20 mg total) by mouth daily. 05/23/16  Yes Elgergawy, Leana Roe, MD  metFORMIN (GLUCOPHAGE) 500 MG tablet Take 1,000 mg by mouth 2 (two) times daily with a meal.    Yes [provider]  OVER THE COUNTER MEDICATION daily.   Yes [provider]  QUEtiapine (SEROQUEL) 25 MG tablet Take 6.25 mg by mouth 2 (two) times daily.    Yes [provider]  sodium chloride 1 g tablet Take 1 tablet (1 g total) by mouth 2 (two) times daily with a meal. 05/22/16  Yes Elgergawy, Leana Roe, MD    No Known Allergies  Patient Active Problem List   Diagnosis Date Noted  . Traumatic bursitis 04/11/2017  . Elbow swelling, left 04/11/2017  . Malnutrition of moderate degree 05/19/2016  . Encephalopathy, metabolic   . Lower back pain   . Prolonged QT interval 05/16/2016  . Hypomagnesemia 05/16/2016  . Hypokalemia 05/16/2016  . Hyponatremia 05/16/2016  . Acute hyponatremia 05/16/2016  . Sepsis (HCC) 05/16/2016  . UTI (lower urinary tract infection) 05/16/2016  . Anemia, unspecified 05/16/2016  . Type 2 diabetes mellitus without complication, without long-term current use of insulin (HCC) 05/16/2016  . Essential hypertension 05/16/2016    Past Medical History:  Diagnosis Date  . Anemia 05/2016  . Cataract   . Diabetes mellitus without complication (HCC)   . Hyperlipidemia   . Hypertension   . Hyponatremia 05/2016  . Osteoarthritis   . Osteopenia     Past Surgical History:  Procedure Laterality Date  . ABDOMINAL HYSTERECTOMY      Social History   Social History  . Marital status: Married    Spouse name: N/A  . Number of children: N/A  . Years of education: N/A   Occupational History  . Not on file.   Social History Main Topics  .  Smoking status: Never Smoker  . Smokeless tobacco: Never Used  . Alcohol use No  . Drug use: No  . Sexual activity: Not on file   Other Topics Concern  . Not on file   Social History Narrative  . No narrative on file    Family History  Problem Relation Age of Onset  . Hypertension Mother   . Lung cancer Father   . Lung cancer Brother   . Heart attack Son      Review of Systems  Constitutional: Negative for chills and fever.  Respiratory: Negative for shortness of breath.   Cardiovascular: Negative for chest pain.  Gastrointestinal: Negative for diarrhea, nausea and  vomiting.  Neurological: Negative for dizziness and headaches.   Vitals:   04/12/17 1424  BP: (!) 170/99  Pulse: 83  Resp: 18  Temp: 97.7 F (36.5 C)  SpO2: 96%    Physical Exam  Constitutional: She appears well-developed and well-nourished.  HENT:  Head: Normocephalic and atraumatic.  Eyes: Pupils are equal, round, and reactive to light. EOM are normal.  Neck: Normal range of motion. Neck supple.  Cardiovascular: Normal rate.   Pulmonary/Chest: Effort normal.  Musculoskeletal:  LUE: reacummulation of fluid in olecranon bursa with dependent edema of distal arm; NVI with FROM; non-tender.   Skin: Skin is warm and dry. Capillary refill takes less than 2 seconds.  Psychiatric: She has a normal mood and affect. Her behavior is normal.  Vitals reviewed.    ASSESSMENT & PLAN: [name] was seen today for arm swelling.  Diagnoses and all orders for this visit:  Olecranon bursitis of left elbow Comments: recurrent Orders: -     Sling immobilizer  Arm edema Comments: left arm     Patient Instructions    Use sling to keep arm elevated. Avoid tight dressings to elbow area.   IF you received an x-ray today, you will receive an invoice from [facility]. Please contact [facility] at [phone] with questions or concerns regarding your invoice.   IF you received labwork today, you will receive an invoice from [facility]. Please contact [facility] at [phone] with questions or concerns regarding your invoice.   Our billing staff will not be able to assist you with questions regarding bills from these companies.  You will be contacted with the lab results as soon as they are available. The fastest way to get your results is to activate your My Chart account. Instructions are located on the last page of this paperwork. If you have not heard from [place] regarding the results in 2 weeks, please contact this office.     Elbow Bursitis A bursa is a  fluid-filled sac that covers and protects a joint. Bursitis is when the fluid-filled sac gets puffy and sore (inflamed). Elbow bursitis, also called olecranon bursitis, happens over your elbow. This may be caused by:  Injury (acute trauma) to your elbow.  Leaning on hard surfaces for long periods of time.  Infection from an injury that breaks the skin near your elbow.  A bone growth (spur) that forms at the tip of your elbow.  A medical condition that causes inflammation in your body, such as: ? Gout. ? Rheumatoid arthritis.  Sometimes the cause is not known. Follow these instructions at home:  Take medicines only as told by your doctor.  If you were prescribed an antibiotic medicine, finish all of it even if you start to feel better.  If your bursitis is caused by an injury, rest  your elbow and wear your bandage as told by your doctor. You may also apply ice to the injured area as told by your doctor: ? Put ice in a plastic bag. ? Place a towel between your skin and the bag. ? Leave the ice on for 20 minutes, 2-3 times per day.  Do not do any activities that cause pain to your elbow.  Use elbow pads or wraps to cushion your elbow. Contact a doctor if:  You have a fever.  Your symptoms do not get better with treatment.  Your pain or swelling gets worse.  Your pain or swelling goes away and then comes back.  You have drainage of pus from the swollen area over your elbow. This information is not intended to replace advice given to you by your health care provider. Make sure you discuss any questions you have with your health care provider. Document Released: 02/08/2010 Document Revised: 01/27/2016 Document Reviewed: 04/29/2014 Elsevier Interactive Patient Education  2018 Elsevier Inc.      Edwina BarthMiguel Lucrezia Dehne, MD [facility]

## 2017-04-12 NOTE — Patient Instructions (Addendum)
  Use sling to keep arm elevated. Avoid tight dressings to elbow area.   IF you received an x-ray today, you will receive an invoice from [facility]. Please contact [facility] at [phone] with questions or concerns regarding your invoice.   IF you received labwork today, you will receive an invoice from [facility]. Please contact [facility] at [phone] with questions or concerns regarding your invoice.   Our billing staff will not be able to assist you with questions regarding bills from these companies.  You will be contacted with the lab results as soon as they are available. The fastest way to get your results is to activate your My Chart account. Instructions are located on the last page of this paperwork. If you have not heard from us regarding the results in 2 weeks, please contact this office.     Elbow Bursitis A bursa is a fluid-filled sac that covers and protects a joint. Bursitis is when the fluid-filled sac gets puffy and sore (inflamed). Elbow bursitis, also called olecranon bursitis, happens over your elbow. This may be caused by:  Injury (acute trauma) to your elbow.  Leaning on hard surfaces for long periods of time.  Infection from an injury that breaks the skin near your elbow.  A bone growth (spur) that forms at the tip of your elbow.  A medical condition that causes inflammation in your body, such as: ? Gout. ? Rheumatoid arthritis.  Sometimes the cause is not known. Follow these instructions at home:  Take medicines only as told by your doctor.  If you were prescribed an antibiotic medicine, finish all of it even if you start to feel better.  If your bursitis is caused by an injury, rest your elbow and wear your bandage as told by your doctor. You may also apply ice to the injured area as told by your doctor: ? Put ice in a plastic bag. ? Place a towel between your skin and the bag. ? Leave the ice on for 20 minutes, 2-3 times per  day.  Do not do any activities that cause pain to your elbow.  Use elbow pads or wraps to cushion your elbow. Contact a doctor if:  You have a fever.  Your symptoms do not get better with treatment.  Your pain or swelling gets worse.  Your pain or swelling goes away and then comes back.  You have drainage of pus from the swollen area over your elbow. This information is not intended to replace advice given to you by your health care provider. Make sure you discuss any questions you have with your health care provider. Document Released: 02/08/2010 Document Revised: 01/27/2016 Document Reviewed: 04/29/2014 Elsevier Interactive Patient Education  [employer]2018 Elsevier Inc.

## 2017-04-13 ENCOUNTER — Telehealth: Payer: Self-pay | Admitting: *Deleted

## 2017-04-16 ENCOUNTER — Encounter: Payer: Self-pay | Admitting: Emergency Medicine

## 2017-04-16 ENCOUNTER — Ambulatory Visit (INDEPENDENT_AMBULATORY_CARE_PROVIDER_SITE_OTHER): Payer: Medicare Other | Admitting: Emergency Medicine

## 2017-04-16 VITALS — BP 151/73 | HR 85 | Temp 97.6°F | Resp 16 | Wt 140.8 lb

## 2017-04-16 DIAGNOSIS — R600 Localized edema: Secondary | ICD-10-CM | POA: Diagnosis not present

## 2017-04-16 DIAGNOSIS — M7022 Olecranon bursitis, left elbow: Secondary | ICD-10-CM | POA: Diagnosis not present

## 2017-04-16 NOTE — Progress Notes (Signed)
Ellen Payne 81 y.o.   Chief Complaint  Patient presents with  . Follow-up    LEFT elbow    HISTORY OF PRESENT ILLNESS: This is a 81 y.o. female here for follow up of left elbow bursitis and left arm edema; doing better; arm edema gone.  HPI   Prior to Admission medications   Medication Sig Start Date End Date Taking? Authorizing Provider  acetaminophen (TYLENOL) 325 MG tablet Take 2 tablets (650 mg total) by mouth every 6 (six) hours as needed for mild pain (or Fever >/= 101). 05/22/16  Yes Elgergawy, Leana Roe, MD  aspirin EC 81 MG tablet Take 81 mg by mouth daily.   Yes [provider]  atorvastatin (LIPITOR) 40 MG tablet Take 40 mg by mouth at bedtime.   Yes [provider]  cefadroxil (DURICEF) 500 MG capsule Take 1 capsule (500 mg total) by mouth 2 (two) times daily. 04/11/17 04/18/17 Yes Camron Essman, Eilleen Kempf, MD  Cholecalciferol (VITAMIN D3) 5000 units TABS Take 1 tablet by mouth daily.   Yes [provider]  cyanocobalamin (,VITAMIN B-12,) 1000 MCG/ML injection Please take once weekly every Monday for 4 weeks, then change to once monthly. 05/22/16  Yes Elgergawy, Leana Roe, MD  feeding supplement, GLUCERNA SHAKE, (GLUCERNA SHAKE) LIQD Take 237 mLs by mouth 2 (two) times daily between meals. 05/23/16  Yes Elgergawy, Leana Roe, MD  FERREX 150 150 MG capsule Take 150 mg by mouth daily. 01/01/17  Yes [provider]  lisinopril (PRINIVIL,ZESTRIL) 20 MG tablet Take 1 tablet (20 mg total) by mouth daily. 05/23/16  Yes Elgergawy, Leana Roe, MD  metFORMIN (GLUCOPHAGE) 500 MG tablet Take 1,000 mg by mouth 2 (two) times daily with a meal.    Yes [provider]  OVER THE COUNTER MEDICATION daily.   Yes [provider]  QUEtiapine (SEROQUEL) 25 MG tablet Take 6.25 mg by mouth 2 (two) times daily.    Yes [provider]  sodium chloride 1 g tablet Take 1 tablet (1 g total) by mouth 2 (two) times daily with a meal. 05/22/16  Yes  Elgergawy, Leana Roe, MD    Not on File  Patient Active Problem List   Diagnosis Date Noted  . Traumatic bursitis 04/11/2017  . Elbow swelling, left 04/11/2017  . Malnutrition of moderate degree 05/19/2016  . Encephalopathy, metabolic   . Lower back pain   . Prolonged QT interval 05/16/2016  . Hypomagnesemia 05/16/2016  . Hypokalemia 05/16/2016  . Hyponatremia 05/16/2016  . Acute hyponatremia 05/16/2016  . Sepsis (HCC) 05/16/2016  . UTI (lower urinary tract infection) 05/16/2016  . Anemia, unspecified 05/16/2016  . Type 2 diabetes mellitus without complication, without long-term current use of insulin (HCC) 05/16/2016  . Essential hypertension 05/16/2016    Past Medical History:  Diagnosis Date  . Anemia 05/2016  . Cataract   . Diabetes mellitus without complication (HCC)   . Hyperlipidemia   . Hypertension   . Hyponatremia 05/2016  . Osteoarthritis   . Osteopenia     Past Surgical History:  Procedure Laterality Date  . ABDOMINAL HYSTERECTOMY      Social History   Social History  . Marital status: Married    Spouse name: N/A  . Number of children: N/A  . Years of education: N/A   Occupational History  . Not on file.   Social History Main Topics  . Smoking status: Never Smoker  . Smokeless tobacco: Never Used  . Alcohol use No  .  Drug use: No  . Sexual activity: Not on file   Other Topics Concern  . Not on file   Social History Narrative  . No narrative on file    Family History  Problem Relation Age of Onset  . Hypertension Mother   . Lung cancer Father   . Lung cancer Brother   . Heart attack Son      Review of Systems  Constitutional: Negative.  Negative for chills and fever.  Respiratory: Negative for shortness of breath.   Cardiovascular: Negative for palpitations.  Gastrointestinal: Negative for abdominal pain, diarrhea, nausea and vomiting.  Skin: Negative for rash.     Vitals:   04/16/17 1116  BP: (!) 151/73  Pulse: 85    Resp: 16  Temp: 97.6 F (36.4 C)  SpO2: 96%     Physical Exam  Constitutional: She is oriented to person, place, and time. She appears well-developed and well-nourished.  HENT:  Head: Normocephalic.  Eyes: Pupils are equal, round, and reactive to light. EOM are normal.  Neck: Normal range of motion.  Cardiovascular: Normal rate.   Pulmonary/Chest: Effort normal.  Musculoskeletal:  LUE: still swelling to olecranon bursa; non-tender; no sign of infection. Arm edema gone; NVI with FROM.  Neurological: She is alert and oriented to person, place, and time.  Skin: Skin is warm and dry. Capillary refill takes less than 2 seconds.  Psychiatric: She has a normal mood and affect. Her behavior is normal.  Vitals reviewed.    ASSESSMENT & PLAN: Ellen MccreedyBarbara was seen today for follow-up.  Diagnoses and all orders for this visit:  Olecranon bursitis of left elbow Comments: improving  Arm edema Comments: much improved   No need to wear left arm sling anymore.   Patient Instructions       IF you received an x-ray today, you will receive an invoice from [facility]. Please contact [facility] at [phone] with questions or concerns regarding your invoice.   IF you received labwork today, you will receive an invoice from [facility]. Please contact [facility] at [phone] with questions or concerns regarding your invoice.   Our billing staff will not be able to assist you with questions regarding bills from these companies.  You will be contacted with the lab results as soon as they are available. The fastest way to get your results is to activate your My Chart account. Instructions are located on the last page of this paperwork. If you have not heard from us regarding the results in 2 weeks, please contact this office.     Elbow Bursitis A bursa is a fluid-filled sac that covers and protects a joint. Bursitis is when the fluid-filled sac gets puffy and sore  (inflamed). Elbow bursitis, also called olecranon bursitis, happens over your elbow. This may be caused by:  Injury (acute trauma) to your elbow.  Leaning on hard surfaces for long periods of time.  Infection from an injury that breaks the skin near your elbow.  A bone growth (spur) that forms at the tip of your elbow.  A medical condition that causes inflammation in your body, such as: ? Gout. ? Rheumatoid arthritis.  Sometimes the cause is not known. Follow these instructions at home:  Take medicines only as told by your doctor.  If you were prescribed an antibiotic medicine, finish all of it even if you start to feel better.  If your bursitis is caused by an injury, rest your elbow and wear your bandage as told by  your doctor. You may also apply ice to the injured area as told by your doctor: ? Put ice in a plastic bag. ? Place a towel between your skin and the bag. ? Leave the ice on for 20 minutes, 2-3 times per day.  Do not do any activities that cause pain to your elbow.  Use elbow pads or wraps to cushion your elbow. Contact a doctor if:  You have a fever.  Your symptoms do not get better with treatment.  Your pain or swelling gets worse.  Your pain or swelling goes away and then comes back.  You have drainage of pus from the swollen area over your elbow. This information is not intended to replace advice given to you by your health care provider. Make sure you discuss any questions you have with your health care provider. Document Released: 02/08/2010 Document Revised: 01/27/2016 Document Reviewed: 04/29/2014 Elsevier Interactive Patient Education  2018 Elsevier Inc.    Edwina Barth, MD [facility]

## 2017-04-16 NOTE — Patient Instructions (Addendum)
     IF you received an x-ray today, you will receive an invoice from [facility]. Please contact [facility] at [phone] with questions or concerns regarding your invoice.   IF you received labwork today, you will receive an invoice from [facility]. Please contact [facility] at [phone] with questions or concerns regarding your invoice.   Our billing staff will not be able to assist you with questions regarding bills from these companies.  You will be contacted with the lab results as soon as they are available. The fastest way to get your results is to activate your My Chart account. Instructions are located on the last page of this paperwork. If you have not heard from us regarding the results in 2 weeks, please contact this office.     Elbow Bursitis A bursa is a fluid-filled sac that covers and protects a joint. Bursitis is when the fluid-filled sac gets puffy and sore (inflamed). Elbow bursitis, also called olecranon bursitis, happens over your elbow. This may be caused by:  Injury (acute trauma) to your elbow.  Leaning on hard surfaces for long periods of time.  Infection from an injury that breaks the skin near your elbow.  A bone growth (spur) that forms at the tip of your elbow.  A medical condition that causes inflammation in your body, such as: ? Gout. ? Rheumatoid arthritis.  Sometimes the cause is not known. Follow these instructions at home:  Take medicines only as told by your doctor.  If you were prescribed an antibiotic medicine, finish all of it even if you start to feel better.  If your bursitis is caused by an injury, rest your elbow and wear your bandage as told by your doctor. You may also apply ice to the injured area as told by your doctor: ? Put ice in a plastic bag. ? Place a towel between your skin and the bag. ? Leave the ice on for 20 minutes, 2-3 times per day.  Do not do any activities that cause pain to your elbow.  Use  elbow pads or wraps to cushion your elbow. Contact a doctor if:  You have a fever.  Your symptoms do not get better with treatment.  Your pain or swelling gets worse.  Your pain or swelling goes away and then comes back.  You have drainage of pus from the swollen area over your elbow. This information is not intended to replace advice given to you by your health care provider. Make sure you discuss any questions you have with your health care provider. Document Released: 02/08/2010 Document Revised: 01/27/2016 Document Reviewed: 04/29/2014 Elsevier Interactive Patient Education  [employer]2018 [employer].

## 2017-04-20 NOTE — Telephone Encounter (Signed)
Entered in error

## 2017-05-08 ENCOUNTER — Ambulatory Visit (INDEPENDENT_AMBULATORY_CARE_PROVIDER_SITE_OTHER): Payer: Medicare Other | Admitting: Podiatry

## 2017-05-08 ENCOUNTER — Encounter: Payer: Self-pay | Admitting: Podiatry

## 2017-05-08 VITALS — BP 154/79 | HR 80

## 2017-05-08 DIAGNOSIS — B351 Tinea unguium: Secondary | ICD-10-CM

## 2017-05-08 DIAGNOSIS — E1151 Type 2 diabetes mellitus with diabetic peripheral angiopathy without gangrene: Secondary | ICD-10-CM

## 2017-05-08 DIAGNOSIS — E1142 Type 2 diabetes mellitus with diabetic polyneuropathy: Secondary | ICD-10-CM

## 2017-05-08 NOTE — Patient Instructions (Signed)

## 2017-05-08 NOTE — Progress Notes (Signed)
Patient ID: Ellen FaceBarbara J Payne, female   DOB: 04/28/1936, 81 y.o.   MRN: [id number]    Subjective: This patient presents today complaining that her toenails are thick and discolored and states that she is unable to trim your toenails and requests nail debridement. Patient has attempted self treatment, however, discontinue self treatment cause of her diabetic condition. Also, patient's granddaughter has trim the nails in the past and now request professional treatment  Patient is diabetic denies history of foot ulceration, claudication or amputation  Patient denies smoking history Patient's granddaughter present to treatment today   Objective:  Orientated 3  Vascular: Bilateral peripheral pitting edema DP pulses 2/4 bilaterally PT pulse 1/4 bilaterally Capillary reflex within normal limits bilaterally  Neurological Sensation to 10 g monofilament wire intact 5/5 bilaterally Vibratory sensation nonreactive bilaterally Ankle reflex reactive bilaterally  Dermatological: No open skin lesions bilaterally Atrophic skin with absent hair growth bilaterally The toenails 6-10 are elongated, incurvated, discolored with texture and color changes  Musculoskeletal: Prominent dorsal base first metatarsal cuneiform right Pes planus bilaterally Hammertoe 2-5 bilaterally Manual motor testing dorsi flexion, plantar flexion 5/5 bilaterally      Assessment & Plan:   Assessment: Diabetic peripheral neuropathy Diabetic peripheral arterial disease Mycotic toenails 6-10  Plan: Today I reviewed the results exam with patient and patient's granddaughter. At this time I made him aware there was slightly decreased feeling and circulation the feet without history of open wounds. Will continue to monitor. I recommended debridement of mycotic toenails and patient verbally consents  The toenails 6-10 were debrided mechanically and electrically without any bleeding  Reappoint 3 months

## 2017-09-18 ENCOUNTER — Ambulatory Visit: Payer: Medicare Other | Admitting: Podiatry

## 2017-11-23 ENCOUNTER — Ambulatory Visit
Admission: RE | Admit: 2017-11-23 | Discharge: 2017-11-23 | Disposition: A | Discharge status: Home / Self Care | Payer: Medicare Other | Source: Ambulatory Visit | Attending: Family Medicine | Admitting: Family Medicine

## 2017-11-23 ENCOUNTER — Other Ambulatory Visit: Payer: Self-pay | Admitting: Family Medicine

## 2017-11-23 DIAGNOSIS — M25511 Pain in right shoulder: Secondary | ICD-10-CM

## 2017-11-26 ENCOUNTER — Other Ambulatory Visit: Payer: Self-pay | Admitting: Family Medicine

## 2017-11-26 DIAGNOSIS — I77810 Thoracic aortic ectasia: Secondary | ICD-10-CM

## 2017-11-27 ENCOUNTER — Ambulatory Visit
Admission: RE | Admit: 2017-11-27 | Discharge: 2017-11-27 | Disposition: A | Discharge status: Home / Self Care | Payer: Medicare Other | Source: Ambulatory Visit | Attending: Family Medicine | Admitting: Family Medicine

## 2017-11-27 DIAGNOSIS — I77810 Thoracic aortic ectasia: Secondary | ICD-10-CM

## 2017-11-27 MED ORDER — IOPAMIDOL (ISOVUE-370) INJECTION 76%
75.0000 mL | Freq: Once | INTRAVENOUS | Status: AC | PRN
  Administered 2017-11-27: 75 mL via INTRAVENOUS

## 2018-08-27 IMAGING — CT CT ANGIO CHEST
1 of 3 series · 18 of 32 positions shown · IV contrast (APPLIED)
Comparison: 05/16/2016

CLINICAL DATA: Ectatic thoracic aorta

EXAM:
CT ANGIOGRAPHY CHEST WITH CONTRAST
TECHNIQUE: Multidetector CT imaging of the chest was performed using the
standard protocol during bolus administration of intravenous
contrast. Multiplanar CT image reconstructions and MIPs were
obtained to evaluate the vascular anatomy.
CONTRAST:  75mL JVKYZA-XSF IOPAMIDOL (JVKYZA-XSF) INJECTION 76%

[Series 10: thins · axial · 0.62mm/px · z∈[-283,-39]mm · 18 of 446 slices shown]
[im 20/446  lung]
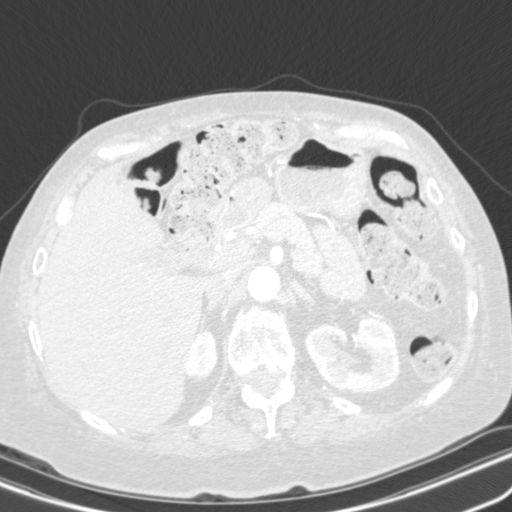
[im 39/446  soft-tissue]
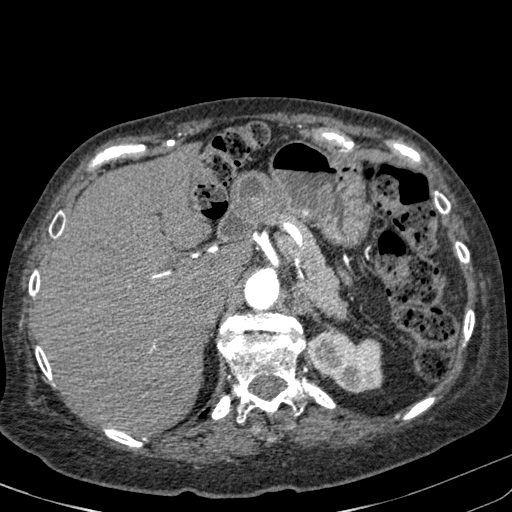
[im 78/446  lung]
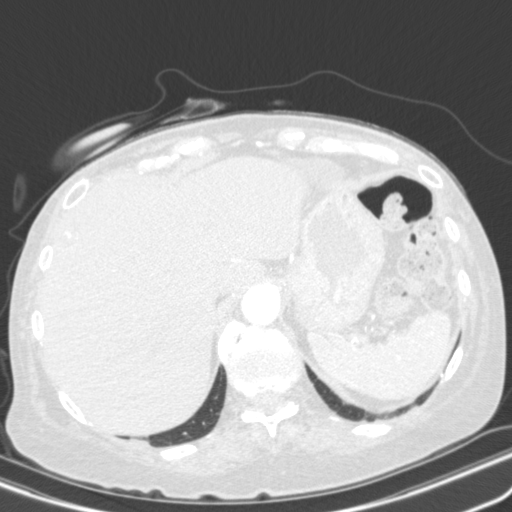
[im 97/446  soft-tissue]
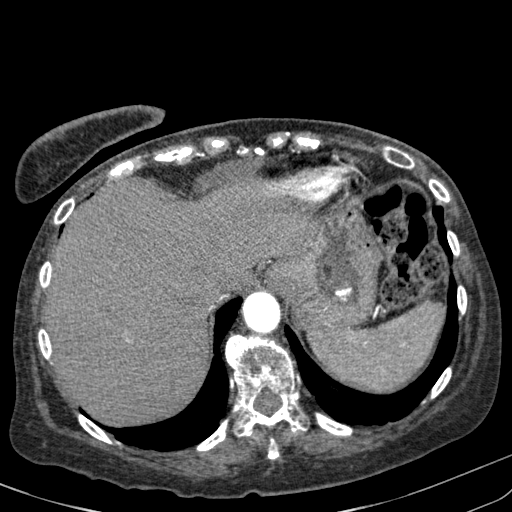
[im 117/446  lung]
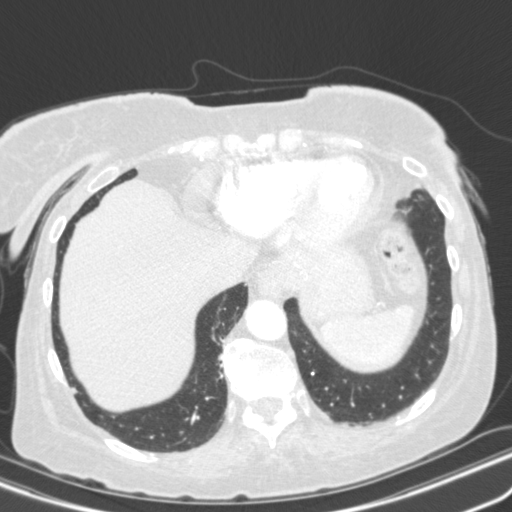
[im 136/446  soft-tissue]
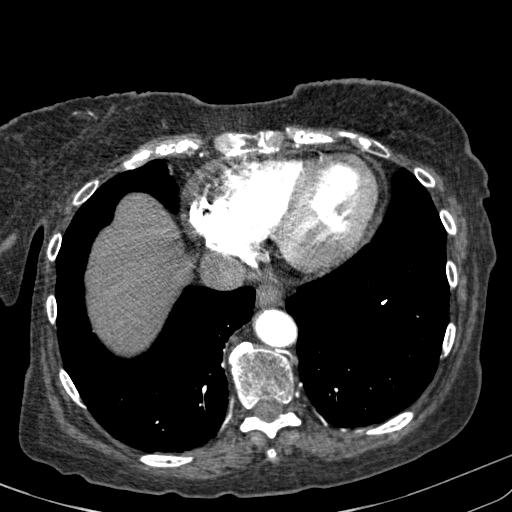
[im 155/446  lung]
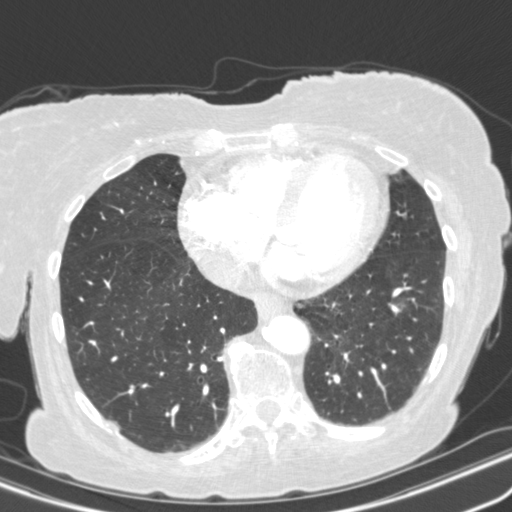
[im 194/446  soft-tissue]
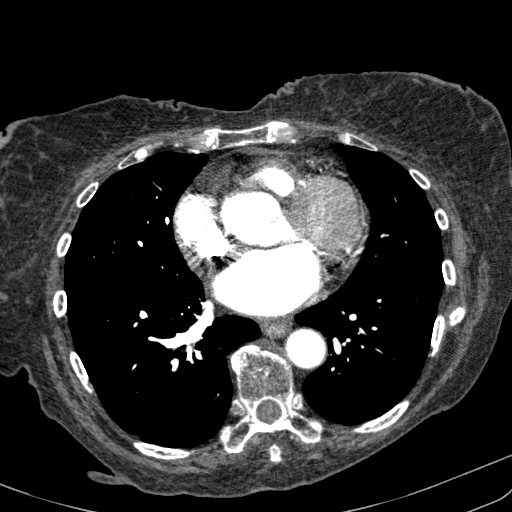
[im 213/446  lung]
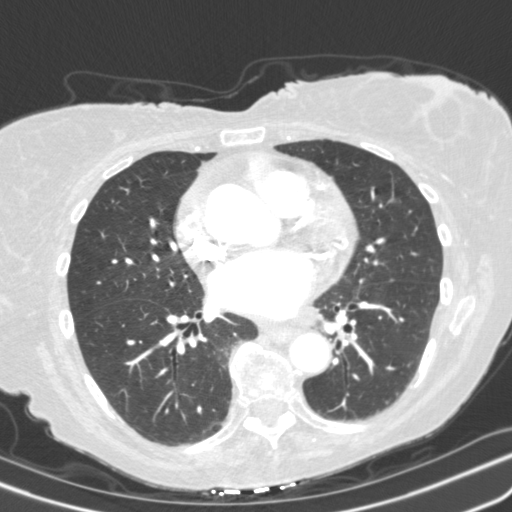
[im 233/446  soft-tissue]
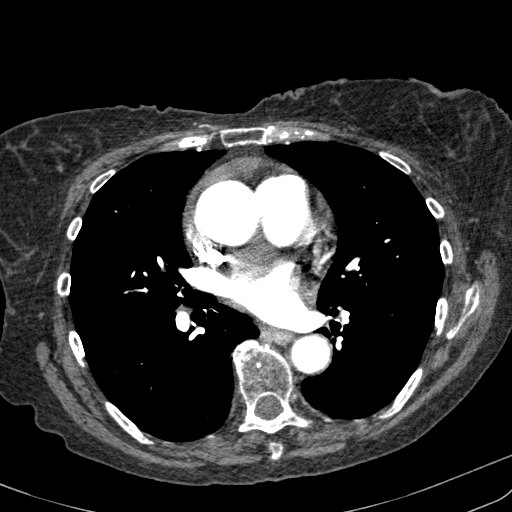
[im 252/446  lung]
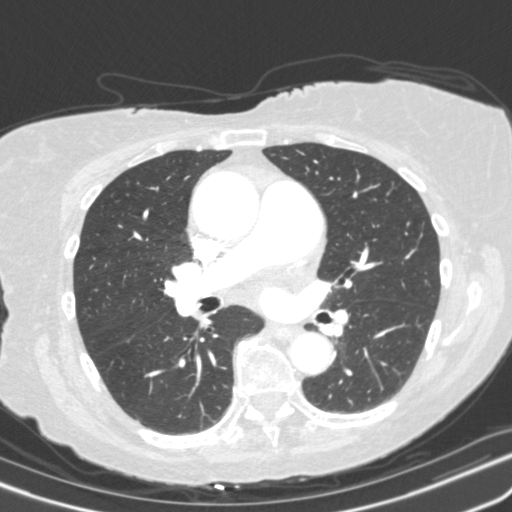
[im 291/446  soft-tissue]
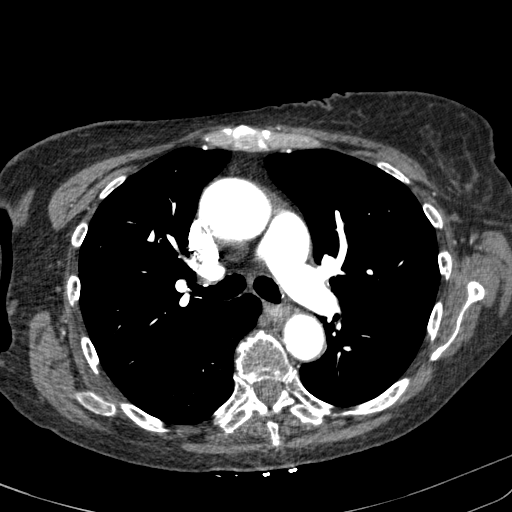
[im 310/446  lung]
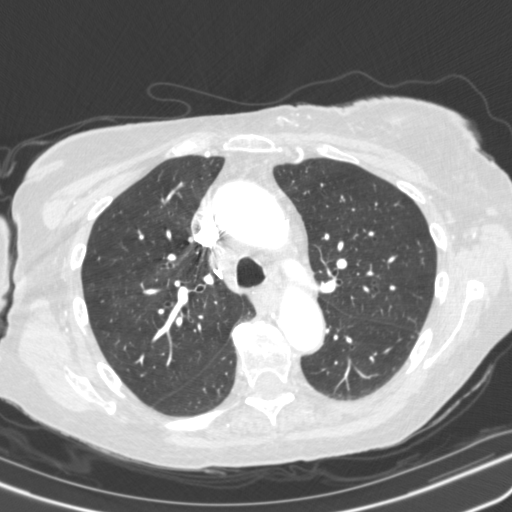
[im 329/446  soft-tissue]
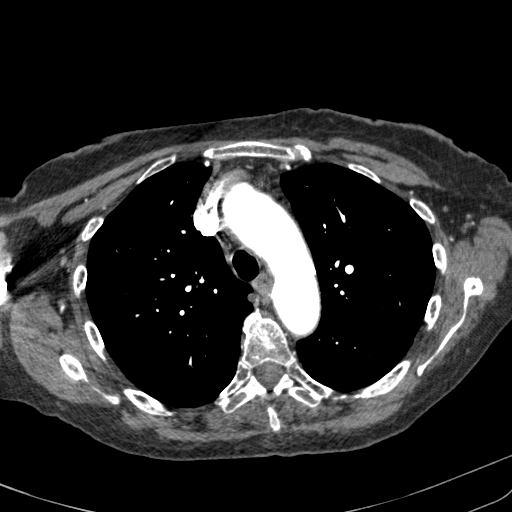
[im 349/446  lung]
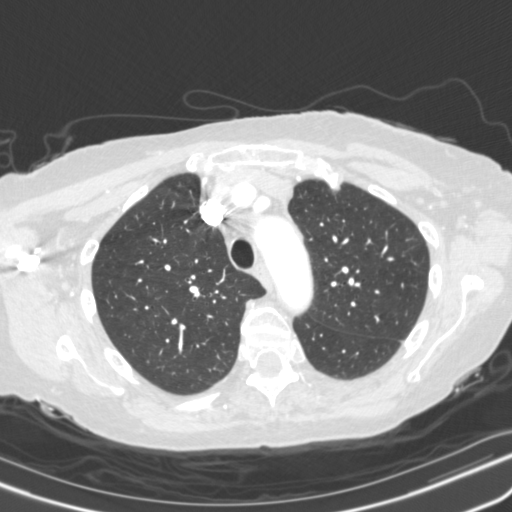
[im 368/446  soft-tissue]
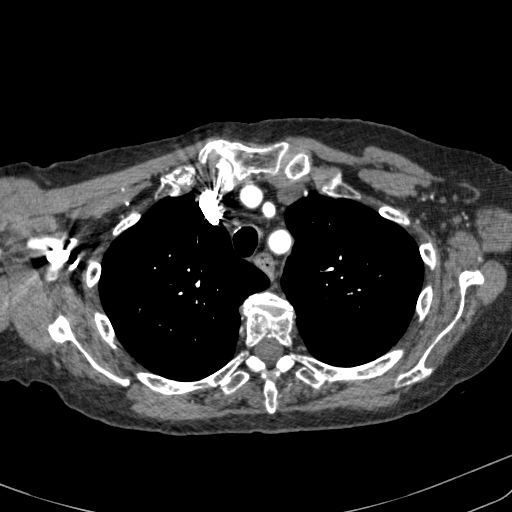
[im 407/446  lung]
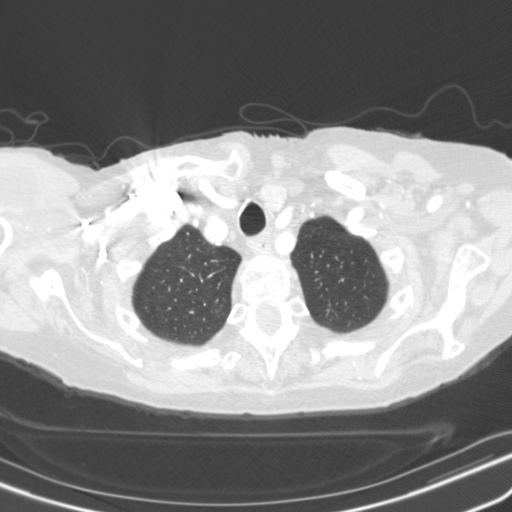
[im 426/446  soft-tissue]
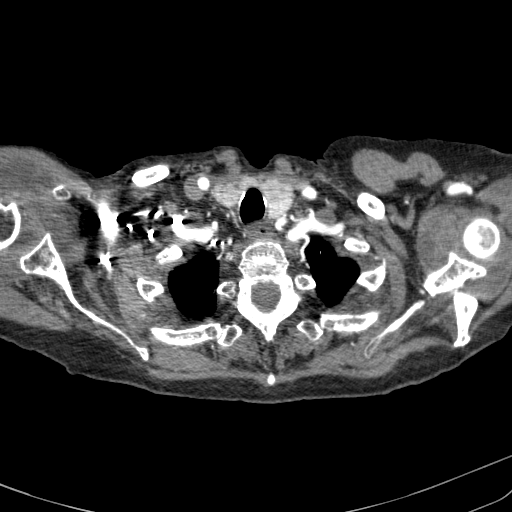

[18 of 32 positions shown; findings below may reference images not displayed]

FINDINGS: Cardiovascular:  No evidence of intramural hematoma

Aortic dimensions:

Valve annulus 25 mm

Sinuses of Valsalva 34 mm

Cin junction 31 mm

Ascending aorta 41 mm

Arch 31 mm

Descending segment 28 mm

No evidence of aortic dissection.

Atherosclerotic calcification along the coronaries and aorta. The
opacified pulmonary arteries are negative for filling defect.

Normal heart size. Small volume pericardial fluid and/or thickening,
stable.

Mediastinum/Nodes: Negative for adenopathy or mass.

Lungs/Pleura: The central airways are clear. There is no edema,
consolidation, effusion, or pneumothorax. Calcified pulmonary nodule
in the right upper lobe. 2 peripheral pulmonary nodules in the right
lower lobe measuring up to 4 mm average diameter, also stable.

Upper Abdomen: 2 cm superficial mass in segment 8 of the liver that
has peripheral discontinuous nodular enhancement. This area was seen
on comparison, when it had a more low-density appearance. There is
also a more nodular focus of avid enhancement in the posterior right
liver measuring 1 cm on [DATE], also stable in retrospect and likely a
flash fill hemangioma. Patient had a pancreatic head cyst on
previous CT. This level is not covered today. Pills are noted in the
stomach.

Musculoskeletal: Glenohumeral osteoarthritis on the right with
distended bursa and joint capsule. Spondylosis and multilevel
ankylosis. Remote T4 superior endplate fracture with minimal
depression.

Review of the MIP images confirms the above findings.
IMPRESSION: 1. Fusiform aneurysmal enlargement of the ascending aorta measuring
up to 4.1 cm, unchanged from 05/16/2016. Recommend annual imaging
followup by CTA or MRA if appropriate for comorbidities. This
recommendation follows [id number]
ACCF/AHA/AATS/ACR/ASA/SCA/DREY/WISEKING/LEONILLO/AWRUK Guidelines for the
Diagnosis and Management of Patients with Thoracic Aortic Disease.
Circulation. 7565; 121: [id number]
2. Aortic Atherosclerosis ([id number]).  Coronary atherosclerosis.
3. Stable small pericardial effusion or thickening.
4. Two incidental hepatic hemangiomas.
5. Glenohumeral osteoarthritis on the right with notable large joint
effusion and distended bursae.

## 2019-04-05 DEATH — deceased
